# Patient Record
Sex: Male | Born: 1960
Health system: Southern US, Community
[De-identification: ages and names within clinical notes are randomized; demographics above are authoritative.]

## PROBLEM LIST (undated history)

## (undated) DIAGNOSIS — N400 Enlarged prostate without lower urinary tract symptoms: Secondary | ICD-10-CM

## (undated) DIAGNOSIS — I10 Essential (primary) hypertension: Secondary | ICD-10-CM

## (undated) DIAGNOSIS — E119 Type 2 diabetes mellitus without complications: Secondary | ICD-10-CM

## (undated) DIAGNOSIS — E785 Hyperlipidemia, unspecified: Secondary | ICD-10-CM

## (undated) DIAGNOSIS — M109 Gout, unspecified: Secondary | ICD-10-CM

## (undated) DIAGNOSIS — F32A Depression, unspecified: Secondary | ICD-10-CM

## (undated) DIAGNOSIS — G4700 Insomnia, unspecified: Secondary | ICD-10-CM

## (undated) DIAGNOSIS — R7989 Other specified abnormal findings of blood chemistry: Secondary | ICD-10-CM

## (undated) DIAGNOSIS — F329 Major depressive disorder, single episode, unspecified: Secondary | ICD-10-CM

## (undated) HISTORY — DX: Other specified abnormal findings of blood chemistry: R79.89

## (undated) HISTORY — DX: Benign prostatic hyperplasia without lower urinary tract symptoms: N40.0

## (undated) HISTORY — DX: Hyperlipidemia, unspecified: E78.5

## (undated) HISTORY — PX: HERNIA REPAIR: SHX51

## (undated) HISTORY — DX: Gout, unspecified: M10.9

## (undated) HISTORY — DX: Depression, unspecified: F32.A

## (undated) HISTORY — DX: Major depressive disorder, single episode, unspecified: F32.9

## (undated) HISTORY — DX: Type 2 diabetes mellitus without complications: E11.9

## (undated) HISTORY — DX: Essential (primary) hypertension: I10

## (undated) HISTORY — DX: Insomnia, unspecified: G47.00

---

## 2002-09-04 ENCOUNTER — Encounter: Admission: RE | Admit: 2002-09-04 | Discharge: 2002-09-04 | Payer: Self-pay | Admitting: Specialist

## 2002-09-04 ENCOUNTER — Encounter: Payer: Self-pay | Admitting: Specialist

## 2011-09-29 ENCOUNTER — Ambulatory Visit: Payer: Self-pay | Admitting: Unknown Physician Specialty

## 2011-09-29 DIAGNOSIS — I10 Essential (primary) hypertension: Secondary | ICD-10-CM

## 2011-09-29 LAB — BASIC METABOLIC PANEL
Anion Gap: 10 (ref 7–16)
BUN: 17 mg/dL (ref 7–18)
Calcium, Total: 9.1 mg/dL (ref 8.5–10.1)
Chloride: 106 mmol/L (ref 98–107)
Co2: 29 mmol/L (ref 21–32)
Creatinine: 0.84 mg/dL (ref 0.60–1.30)
EGFR (African American): >60
EGFR (Non-African Amer.): >60
Glucose: 142 mg/dL — ABNORMAL HIGH (ref 65–99)
Osmolality: 293 (ref 275–301)
Potassium: 3.5 mmol/L (ref 3.5–5.1)
Sodium: 145 mmol/L (ref 136–145)

## 2011-10-03 ENCOUNTER — Ambulatory Visit: Payer: Self-pay | Admitting: General Surgery

## 2013-06-06 ENCOUNTER — Encounter: Payer: 59 | Attending: Family Medicine | Admitting: Dietician

## 2013-06-06 ENCOUNTER — Encounter: Payer: Self-pay | Admitting: Dietician

## 2013-06-06 VITALS — Ht 72.0 in | Wt 294.9 lb

## 2013-06-06 DIAGNOSIS — Z713 Dietary counseling and surveillance: Secondary | ICD-10-CM | POA: Insufficient documentation

## 2013-06-06 DIAGNOSIS — E119 Type 2 diabetes mellitus without complications: Secondary | ICD-10-CM | POA: Insufficient documentation

## 2013-06-06 NOTE — Patient Instructions (Signed)
Goals:  Follow Diabetes Meal Plan as instructed  Eat 3 meals and 2 snacks, every 3-5 hrs  Limit carbohydrate intake to 45-60 grams carbohydrate/meal  Limit carbohydrate intake to 15-30 grams carbohydrate/snack  Add lean protein foods to meals/snacks  Monitor glucose levels as instructed by your doctor  Aim for 30 mins of physical activity daily  Bring food record and glucose log to your next nutrition visit 

## 2013-06-06 NOTE — Progress Notes (Signed)
Patient was seen on 06/06/13 for the first of a series of three diabetes self-management courses at the [facility].   Current HbA1c: 11.0 late July  The following learning objectives were met by the patient during this course:   Defines the role of glucose and insulin  Identifies type of diabetes and pathophysiology  Defines the diagnostic criteria for diabetes and prediabetes  States the risk factors for Type 2 Diabetes  States the symptoms of Type 2 Diabetes  Defines Type 2 Diabetes treatment goals  Defines Type 2 Diabetes treatment options  States the rationale for glucose monitoring  Identifies A1C, glucose targets, and testing times  Identifies proper sharps disposal  Defines the purpose of a diabetes food plan  Identifies carbohydrate food groups  Defines effects of carbohydrate foods on glucose levels  Identifies carbohydrate choices/grams/food labels  States benefits of physical activity and effect on glucose  Review of suggested activity guidelines  Handouts given during class include:  Type 2 Diabetes: Basics Book  My Food Plan Book  Food and Activity Log  Your patient has identified their diabetes self-care support plan as:  [facility] support group  Wife  Follow-Up Plan: Attend core 2 and core 3

## 2013-07-11 ENCOUNTER — Encounter: Payer: 59 | Attending: Family Medicine

## 2013-07-11 DIAGNOSIS — Z713 Dietary counseling and surveillance: Secondary | ICD-10-CM | POA: Insufficient documentation

## 2013-07-11 DIAGNOSIS — E119 Type 2 diabetes mellitus without complications: Secondary | ICD-10-CM | POA: Insufficient documentation

## 2013-07-19 NOTE — Progress Notes (Signed)
  Patient was seen on 07/11/13 for the third of a series of three diabetes self-management courses at the [facility]. The following learning objectives were met by the patient during this course:    Describe how diabetes changes over time   Identify diabetes complications and ways to prevent them   Describe strategies that can promote heart health including lowering blood pressure and cholesterol   Describe strategies to lower dietary fat and sodium in the diet   Identify physical activities that benefit cardiovascular health   Evaluate success in meeting personal goal   Describe the belief that they can live successfully with diabetes day to day   Establish 2-3 goals that they will plan to diligently work on until they return for the 4 month follow-up visit  The following handouts were given in class:  4 Month Follow Up Visit handout  Goal setting handout  Class evaluation form  Your patient has established the following 4 month goals for diabetes self-care:  Walk and start swimming again  Increase activity to 45 minutes 3Xwk  Plotting in [employer]  Follow-Up Plan: Patient will attend a 67month follow-up visit for diabetes self-management education.

## 2015-01-18 NOTE — Op Note (Signed)
PATIENT NAME:  Derek Duarte, Derek Duarte MR#:  [id number] DATE OF BIRTH:  1961-09-20  DATE OF PROCEDURE:  10/03/2011  PREOPERATIVE DIAGNOSIS: Umbilical hernia with incarcerated fat.   POSTOPERATIVE DIAGNOSIS: Umbilical hernia with incarcerated fat.  OPERATIVE PROCEDURE: Umbilical hernia primary repair.   OPERATING SURGEON: Robert Bellow, MD   ANESTHESIA: General by LMA under Dr. [place] Service, Marcaine 0.5% 30 mL plain, Toradol 30 mg.   ESTIMATED BLOOD LOSS: Minimal.   CLINICAL NOTE: This 54 year old male has developed a symptomatic umbilical hernia and was admitted for elective repair. He received Kefzol prior to the procedure.   OPERATIVE NOTE: With the patient under adequate general anesthesia, the abdomen was prepped with ChloraPrep and draped. The patient had shaved the abdomen the morning of surgery at home (he had not been instructed to do so). No gross evidence of skin trauma was evident. An infraumbilical incision was made after instillation of local anesthesia. Skin was incised sharply and hemostasis achieved by electrocautery. The umbilical skin was elevated and separated from the sac which was excised at the fascial level and discarded. There was a 4 cm mass of omentum through a 1.2 cm fascial defect. This was excised and discarded with hemostasis achieved by 2-0 Vicryl ties. The fascial defect was felt small enough for primary repair. Interrupted 0 Surgilon sutures were placed under direct vision. They were brought together without tension. They were placed about 4 mm apart and carefully tied. The area was infiltrated with Marcaine and Toradol instilled into the tissues for postoperative analgesia. The umbilical skin was tacked to the fascia with 2-0 Vicryl. The adipose layer was closed with a running 2-0 Vicryl. The skin was closed with a running 4-0 Vicryl subcuticular suture. Benzoin, Steri-Strips, Telfa, and Tegaderm dressings were applied.      The patient tolerated the procedure  well and was taken to the recovery room in stable condition.   ____________________________ Robert Bellow, MD jwb:drc D: 10/03/2011 17:08:41 ET T: 10/04/2011 09:22:15 ET JOB#: [id number]  cc: Robert Bellow, MD, <Dictator> Dr. Melinda Crutch, [place], [facility]  JEFFREY Amedeo Kinsman MD ELECTRONICALLY SIGNED 10/10/2011 22:05

## 2016-04-29 ENCOUNTER — Emergency Department (HOSPITAL_COMMUNITY)
Admission: EM | Admit: 2016-04-29 | Discharge: 2016-04-29 | Disposition: A | Discharge status: Home / Self Care | Payer: Managed Care, Other (non HMO) | Attending: Emergency Medicine | Admitting: Emergency Medicine

## 2016-04-29 ENCOUNTER — Encounter (HOSPITAL_COMMUNITY): Payer: Self-pay

## 2016-04-29 DIAGNOSIS — E119 Type 2 diabetes mellitus without complications: Secondary | ICD-10-CM | POA: Insufficient documentation

## 2016-04-29 DIAGNOSIS — Z7984 Long term (current) use of oral hypoglycemic drugs: Secondary | ICD-10-CM | POA: Insufficient documentation

## 2016-04-29 DIAGNOSIS — T782XXA Anaphylactic shock, unspecified, initial encounter: Secondary | ICD-10-CM | POA: Insufficient documentation

## 2016-04-29 DIAGNOSIS — Z79899 Other long term (current) drug therapy: Secondary | ICD-10-CM | POA: Insufficient documentation

## 2016-04-29 DIAGNOSIS — I10 Essential (primary) hypertension: Secondary | ICD-10-CM | POA: Insufficient documentation

## 2016-04-29 MED ORDER — PREDNISONE 20 MG PO TABS: 60.0000 mg | ORAL_TABLET | Freq: Every day | ORAL | 0 refills | Status: DC

## 2016-04-29 MED ORDER — FAMOTIDINE 20 MG PO TABS: 20.0000 mg | ORAL_TABLET | Freq: Two times a day (BID) | ORAL | 0 refills | Status: DC

## 2016-04-29 MED ORDER — EPINEPHRINE 0.3 MG/0.3ML IJ SOAJ: 0.3000 mg | Freq: Once | INTRAMUSCULAR | 1 refills | Status: AC

## 2016-04-29 MED ORDER — METHYLPREDNISOLONE SODIUM SUCC 125 MG IJ SOLR
125.0000 mg | Freq: Once | INTRAMUSCULAR | Status: AC
  Administered 2016-04-29: 125 mg via INTRAVENOUS
  Filled 2016-04-29: qty 2

## 2016-04-29 MED ORDER — DIPHENHYDRAMINE HCL 25 MG PO CAPS: 25.0000 mg | ORAL_CAPSULE | Freq: Four times a day (QID) | ORAL | 0 refills | Status: DC | PRN

## 2016-04-29 MED ORDER — FAMOTIDINE IN NACL 20-0.9 MG/50ML-% IV SOLN
20.0000 mg | Freq: Once | INTRAVENOUS | Status: AC
  Administered 2016-04-29: 20 mg via INTRAVENOUS
  Filled 2016-04-29: qty 50

## 2016-04-29 NOTE — ED Provider Notes (Signed)
[facility] DEPT Provider Note   CSN: [id number] Arrival date & time: 04/29/16  1514  First Provider Contact:  First MD Initiated Contact with Patient 04/29/16 1525        History   Chief Complaint Chief Complaint  Patient presents with  . Allergic Reaction    HPI Derek Duarte is a 55 y.o. male.  Patient is a Neurosurgeon. He does not have a history of allergic reactions to bee stings but because of his frequent bee stings he has an EpiPen that he carries with him. Patient was stung by yellow jackets today and not diabetes. He had approximately 20 stings. He started developing itching and hives. He went to his doctor's office and was given an epinephrine injection with subsequent improvement of his symptoms. He was also given Benadryl. Patient was sent to the emergency room to be monitored. He is feeling much better now. He denies any shortness of breath. No difficulty swallowing.   The history is provided by the patient.  Allergic Reaction  Presenting symptoms: itching and rash   Presenting symptoms: no difficulty breathing, no difficulty swallowing, no swelling and no wheezing   Severity:  Moderate Duration: Symptoms started today after being stung by multiple yellow jackets. Prior allergic episodes:  No prior episodes Context: insect bite/sting   Relieved by:  Antihistamines and epinephrine Worsened by:  Nothing   Past Medical History:  Diagnosis Date  . Diabetes mellitus without complication ([place])   . Hypertension     There are no active problems to display for this patient.   Past Surgical History:  Procedure Laterality Date  . HERNIA REPAIR         Home Medications    Prior to Admission medications   Medication Sig Start Date End Date Taking? Authorizing Provider  allopurinol (ZYLOPRIM) 300 MG tablet Take 300 mg by mouth daily.   Yes Historical Provider, MD  amLODipine (NORVASC) 10 MG tablet Take 10 mg by mouth daily.   Yes Historical Provider, MD    escitalopram (LEXAPRO) 10 MG tablet Take 10 mg by mouth daily. 04/15/16  Yes Historical Provider, MD  glimepiride (AMARYL) 4 MG tablet Take 4 mg by mouth 2 (two) times daily. 02/07/16  Yes Historical Provider, MD  lisinopril-hydrochlorothiazide (PRINZIDE,ZESTORETIC) 10-12.5 MG per tablet Take 1 tablet by mouth daily.   Yes Historical Provider, MD  metFORMIN (GLUCOPHAGE) 500 MG tablet Take 500 mg by mouth 2 (two) times daily with a meal.   Yes Historical Provider, MD  traZODone (DESYREL) 150 MG tablet Take 150 mg by mouth at bedtime. 03/15/16  Yes Historical Provider, MD  diphenhydrAMINE (BENADRYL) 25 mg capsule Take 1 capsule (25 mg total) by mouth every 6 (six) hours as needed. 04/29/16   Dorie Rank, MD  EPINEPHrine 0.3 mg/0.3 mL IJ SOAJ injection Inject 0.3 mLs (0.3 mg total) into the muscle once. 04/29/16 04/29/16  Dorie Rank, MD  famotidine (PEPCID) 20 MG tablet Take 1 tablet (20 mg total) by mouth 2 (two) times daily. 04/29/16   Dorie Rank, MD  predniSONE (DELTASONE) 20 MG tablet Take 3 tablets (60 mg total) by mouth daily. 04/29/16   Dorie Rank, MD    Family History Family History  Problem Relation Age of Onset  . Hypertension Other     Social History Social History  Substance Use Topics  . Smoking status: Never Smoker  . Smokeless tobacco: Never Used  . Alcohol use 1.2 oz/week    2 Cans of beer per week  Allergies   Review of patient's allergies indicates no known allergies.   Review of Systems Review of Systems  HENT: Negative for trouble swallowing.   Respiratory: Negative for wheezing.   Skin: Positive for itching and rash.  All other systems reviewed and are negative.    Physical Exam Updated Vital Signs There were no vitals taken for this visit.  Physical Exam  Constitutional: He appears well-developed and well-nourished. No distress.  HENT:  Head: Normocephalic and atraumatic.  Right Ear: External ear normal.  Left Ear: External ear normal.  No pharyngeal edema, no  uvular edema  Eyes: Conjunctivae are normal. Right eye exhibits no discharge. Left eye exhibits no discharge. No scleral icterus.  Neck: Neck supple. No tracheal deviation present.  Cardiovascular: Normal rate, regular rhythm and intact distal pulses.   Pulmonary/Chest: Effort normal and breath sounds normal. No stridor. No respiratory distress. He has no wheezes. He has no rales.  Abdominal: Soft. Bowel sounds are normal. He exhibits no distension. There is no tenderness. There is no rebound and no guarding.  Musculoskeletal: He exhibits no edema or tenderness.  Neurological: He is alert. He has normal strength. No cranial nerve deficit (no facial droop, extraocular movements intact, no slurred speech) or sensory deficit. He exhibits normal muscle tone. He displays no seizure activity. Coordination normal.  Skin: Skin is warm and dry. No rash noted.  No urticaria  Psychiatric: He has a normal mood and affect.  Nursing note and vitals reviewed.    ED Treatments / Results    Procedures Procedures (including critical care time)  Medications Ordered in ED Medications  methylPREDNISolone sodium succinate (SOLU-MEDROL) 125 mg/2 mL injection 125 mg (125 mg Intravenous Given 04/29/16 1639)  famotidine (PEPCID) IVPB 20 mg premix (0 mg Intravenous Stopped 04/29/16 1737)     Initial Impression / Assessment and Plan / ED Course  I have reviewed the triage vital signs and the nursing notes.  Pertinent labs & imaging results that were available during my care of the patient were reviewed by me and considered in my medical decision making (see chart for details).  Clinical Course  Comment By Time  Patient continues to feel fine without any recurrent symptoms Dorie Rank, MD 08/04 1724    Patient presented to the emergency room after receiving epinephrine for an anaphylactic reaction associated with yellow jacket stings. The patient has been monitored in the emergency room for a couple of hours. He  has remained symptom free. Plan on discharge home with antihistamines, steroids and an EpiPen.   Final Clinical Impressions(s) / ED Diagnoses   Final diagnoses:  Anaphylaxis, initial encounter    New Prescriptions New Prescriptions   DIPHENHYDRAMINE (BENADRYL) 25 MG CAPSULE    Take 1 capsule (25 mg total) by mouth every 6 (six) hours as needed.   EPINEPHRINE 0.3 MG/0.3 ML IJ SOAJ INJECTION    Inject 0.3 mLs (0.3 mg total) into the muscle once.   FAMOTIDINE (PEPCID) 20 MG TABLET    Take 1 tablet (20 mg total) by mouth 2 (two) times daily.   PREDNISONE (DELTASONE) 20 MG TABLET    Take 3 tablets (60 mg total) by mouth daily.     Dorie Rank, MD 04/29/16 1739

## 2016-04-29 NOTE — ED Triage Notes (Addendum)
Per [facility] pt. Has hives redness and swelling to face. The hives have decreased. Pt. Reports that 20 yellow jackets stung him while moving a log. He reports taking benadryl - 50 Mg. Had a friend drive him to [employer]. Ems reports that at [facility] they gave 3 mg in deltoid of Epi. Then called EMS to transport him to hospital. No medications given in route. Did a 12 lead because HR was 104 and had some HTN when EMS picked him up. Pt. Reports no difficulty breathing but some numbness around mouth and lips and voice change. EMS reports clear lung sounds.  HR 104, BP 178/100 on scene, and CBG 252  Recent BP 146/92 HR 103 o2 93-94 and 96 on O2. On 2L nasal canula

## 2016-04-29 NOTE — ED Notes (Signed)
Bed: WA01 Expected date:  Expected time:  Means of arrival:  Comments: Allergic reaction

## 2016-04-29 NOTE — ED Notes (Signed)
Vitals signs were on done but never put into system by Roselyn Reef, RN but as I recall they were within  normal limits.

## 2016-04-29 NOTE — ED Notes (Signed)
MD at bedside. 

## 2016-04-29 NOTE — ED Notes (Signed)
Pt was cycling on monitor for vital signs. Was not recorded or approved in system to show up on chart.

## 2016-09-30 DIAGNOSIS — S91209A Unspecified open wound of unspecified toe(s) with damage to nail, initial encounter: Secondary | ICD-10-CM | POA: Diagnosis not present

## 2016-11-28 DIAGNOSIS — E291 Testicular hypofunction: Secondary | ICD-10-CM | POA: Diagnosis not present

## 2016-11-28 DIAGNOSIS — E1165 Type 2 diabetes mellitus with hyperglycemia: Secondary | ICD-10-CM | POA: Diagnosis not present

## 2017-03-08 DIAGNOSIS — E1165 Type 2 diabetes mellitus with hyperglycemia: Secondary | ICD-10-CM | POA: Diagnosis not present

## 2017-03-08 DIAGNOSIS — D229 Melanocytic nevi, unspecified: Secondary | ICD-10-CM | POA: Diagnosis not present

## 2017-03-21 DIAGNOSIS — H10413 Chronic giant papillary conjunctivitis, bilateral: Secondary | ICD-10-CM | POA: Diagnosis not present

## 2017-03-21 DIAGNOSIS — E119 Type 2 diabetes mellitus without complications: Secondary | ICD-10-CM | POA: Diagnosis not present

## 2017-06-01 DIAGNOSIS — L821 Other seborrheic keratosis: Secondary | ICD-10-CM | POA: Diagnosis not present

## 2017-06-01 DIAGNOSIS — Z1283 Encounter for screening for malignant neoplasm of skin: Secondary | ICD-10-CM | POA: Diagnosis not present

## 2017-06-15 DIAGNOSIS — Z23 Encounter for immunization: Secondary | ICD-10-CM | POA: Diagnosis not present

## 2017-06-15 DIAGNOSIS — E291 Testicular hypofunction: Secondary | ICD-10-CM | POA: Diagnosis not present

## 2017-06-15 DIAGNOSIS — E1165 Type 2 diabetes mellitus with hyperglycemia: Secondary | ICD-10-CM | POA: Diagnosis not present

## 2017-06-15 DIAGNOSIS — I10 Essential (primary) hypertension: Secondary | ICD-10-CM | POA: Diagnosis not present

## 2017-06-15 DIAGNOSIS — Z0000 Encounter for general adult medical examination without abnormal findings: Secondary | ICD-10-CM | POA: Diagnosis not present

## 2017-06-16 DIAGNOSIS — E1165 Type 2 diabetes mellitus with hyperglycemia: Secondary | ICD-10-CM | POA: Diagnosis not present

## 2017-06-16 DIAGNOSIS — Z0000 Encounter for general adult medical examination without abnormal findings: Secondary | ICD-10-CM | POA: Diagnosis not present

## 2017-06-16 DIAGNOSIS — E291 Testicular hypofunction: Secondary | ICD-10-CM | POA: Diagnosis not present

## 2017-06-23 DIAGNOSIS — D225 Melanocytic nevi of trunk: Secondary | ICD-10-CM | POA: Diagnosis not present

## 2017-06-23 DIAGNOSIS — C44519 Basal cell carcinoma of skin of other part of trunk: Secondary | ICD-10-CM | POA: Diagnosis not present

## 2017-06-23 DIAGNOSIS — D485 Neoplasm of uncertain behavior of skin: Secondary | ICD-10-CM | POA: Diagnosis not present

## 2017-06-23 DIAGNOSIS — L918 Other hypertrophic disorders of the skin: Secondary | ICD-10-CM | POA: Diagnosis not present

## 2017-10-06 DIAGNOSIS — E1140 Type 2 diabetes mellitus with diabetic neuropathy, unspecified: Secondary | ICD-10-CM | POA: Diagnosis not present

## 2017-10-06 DIAGNOSIS — R591 Generalized enlarged lymph nodes: Secondary | ICD-10-CM | POA: Diagnosis not present

## 2017-12-11 DIAGNOSIS — Z79899 Other long term (current) drug therapy: Secondary | ICD-10-CM | POA: Diagnosis not present

## 2017-12-11 DIAGNOSIS — E291 Testicular hypofunction: Secondary | ICD-10-CM | POA: Diagnosis not present

## 2017-12-11 DIAGNOSIS — E1165 Type 2 diabetes mellitus with hyperglycemia: Secondary | ICD-10-CM | POA: Diagnosis not present

## 2018-02-23 DIAGNOSIS — E291 Testicular hypofunction: Secondary | ICD-10-CM | POA: Diagnosis not present

## 2018-02-23 DIAGNOSIS — E1165 Type 2 diabetes mellitus with hyperglycemia: Secondary | ICD-10-CM | POA: Diagnosis not present

## 2018-03-22 DIAGNOSIS — E119 Type 2 diabetes mellitus without complications: Secondary | ICD-10-CM | POA: Diagnosis not present

## 2018-04-11 DIAGNOSIS — K210 Gastro-esophageal reflux disease with esophagitis: Secondary | ICD-10-CM | POA: Diagnosis not present

## 2018-05-31 DIAGNOSIS — E1165 Type 2 diabetes mellitus with hyperglycemia: Secondary | ICD-10-CM | POA: Diagnosis not present

## 2018-05-31 DIAGNOSIS — Z0000 Encounter for general adult medical examination without abnormal findings: Secondary | ICD-10-CM | POA: Diagnosis not present

## 2018-05-31 DIAGNOSIS — M109 Gout, unspecified: Secondary | ICD-10-CM | POA: Diagnosis not present

## 2018-05-31 DIAGNOSIS — Z13220 Encounter for screening for lipoid disorders: Secondary | ICD-10-CM | POA: Diagnosis not present

## 2018-05-31 DIAGNOSIS — E291 Testicular hypofunction: Secondary | ICD-10-CM | POA: Diagnosis not present

## 2018-06-04 DIAGNOSIS — Z23 Encounter for immunization: Secondary | ICD-10-CM | POA: Diagnosis not present

## 2018-06-04 DIAGNOSIS — E785 Hyperlipidemia, unspecified: Secondary | ICD-10-CM | POA: Diagnosis not present

## 2018-06-04 DIAGNOSIS — Z0000 Encounter for general adult medical examination without abnormal findings: Secondary | ICD-10-CM | POA: Diagnosis not present

## 2018-06-04 DIAGNOSIS — E1169 Type 2 diabetes mellitus with other specified complication: Secondary | ICD-10-CM | POA: Diagnosis not present

## 2018-06-04 DIAGNOSIS — I10 Essential (primary) hypertension: Secondary | ICD-10-CM | POA: Diagnosis not present

## 2018-07-26 DIAGNOSIS — H903 Sensorineural hearing loss, bilateral: Secondary | ICD-10-CM | POA: Diagnosis not present

## 2018-08-07 DIAGNOSIS — I639 Cerebral infarction, unspecified: Secondary | ICD-10-CM | POA: Diagnosis not present

## 2018-08-07 DIAGNOSIS — R42 Dizziness and giddiness: Secondary | ICD-10-CM | POA: Diagnosis not present

## 2018-08-07 DIAGNOSIS — R413 Other amnesia: Secondary | ICD-10-CM | POA: Diagnosis not present

## 2018-08-09 ENCOUNTER — Other Ambulatory Visit: Payer: Self-pay | Admitting: Family Medicine

## 2018-08-09 DIAGNOSIS — I639 Cerebral infarction, unspecified: Secondary | ICD-10-CM

## 2018-08-11 ENCOUNTER — Ambulatory Visit
Admission: RE | Admit: 2018-08-11 | Discharge: 2018-08-11 | Disposition: A | Discharge status: Home / Self Care | Payer: Managed Care, Other (non HMO) | Source: Ambulatory Visit | Attending: Family Medicine | Admitting: Family Medicine

## 2018-08-11 DIAGNOSIS — I639 Cerebral infarction, unspecified: Secondary | ICD-10-CM

## 2018-08-11 DIAGNOSIS — R42 Dizziness and giddiness: Secondary | ICD-10-CM | POA: Diagnosis not present

## 2018-08-13 ENCOUNTER — Ambulatory Visit
Admission: RE | Admit: 2018-08-13 | Discharge: 2018-08-13 | Disposition: A | Discharge status: Home / Self Care | Payer: Managed Care, Other (non HMO) | Source: Ambulatory Visit | Attending: Family Medicine | Admitting: Family Medicine

## 2018-08-13 DIAGNOSIS — G459 Transient cerebral ischemic attack, unspecified: Secondary | ICD-10-CM | POA: Diagnosis not present

## 2018-08-13 DIAGNOSIS — I639 Cerebral infarction, unspecified: Secondary | ICD-10-CM

## 2018-08-28 ENCOUNTER — Encounter: Payer: Self-pay | Admitting: Neurology

## 2018-08-28 ENCOUNTER — Encounter

## 2018-08-28 ENCOUNTER — Ambulatory Visit (INDEPENDENT_AMBULATORY_CARE_PROVIDER_SITE_OTHER): Payer: 59 | Admitting: Neurology

## 2018-08-28 VITALS — BP 161/88 | Ht 72.0 in | Wt 304.6 lb

## 2018-08-28 DIAGNOSIS — R42 Dizziness and giddiness: Secondary | ICD-10-CM | POA: Diagnosis not present

## 2018-08-28 DIAGNOSIS — G459 Transient cerebral ischemic attack, unspecified: Secondary | ICD-10-CM | POA: Diagnosis not present

## 2018-08-28 NOTE — Progress Notes (Signed)
[facility] [address] [place]. [place] [address] [phone]       OFFICE CONSULT NOTE  [name] Date of Birth:  12/29/1960 Medical Record Number:  [id number]   Referring MD: Melinda Derek Duarte  Reason for Referral: TIA HPI: [name] is a 57 year old Caucasian male seen today for initial office consultation visit for TIA.  History is obtained from the patient and review of referral notes as well as electronic medical records.  I personally reviewed imaging films in PACS.  He developed sudden onset of dizziness on 08/03/2018 when he bent down he had trouble getting up because he lost his balance.  He was able to sit up but felt off balance and dizzy.  He also noticed his left hand was weak and clumsy.  He has diminished fine motor skills while using his hand.  The symptoms started improving in an hour or so but he states his dizziness is still not completely recovered back to normal.  He saw his primary physician the next day who ordered an MRI scan of the brain which was done on 08/11/2018 which I personally reviewed and was normal.  Carotid ultrasound done on 08/13/2018 shows no significant extracranial stenosis.  He states he had lab work done in September this year and hemoglobin A1c was 7.2 and lipid profile was satisfactory except elevated triglycerides.  He was started on Crestor 5 mg but he takes it only 3 days a week.  He states his blood pressure is also not well controlled and today it is 161/88.  He has been started on aspirin which is tolerating well without bruising or bleeding.  He has only minor paresthesias at night occasionally from his diabetic neuropathy.  He states overall his gait and balance are fine.  He denies true vertigo, diplopia, blurred vision focal extremity numbness during this episode. ROS:   14 system review of systems is positive for hearing loss, ringing in the ears, importance, urination problems, headache, weakness, dizziness, tremor,  depression, insomnia and all other symptoms systems negative  PMH:  Past Medical History:  Diagnosis Date  . BPH (benign prostatic hyperplasia)   . Depression   . Diabetes mellitus without complication ([place])   . Gout   . Hyperlipidemia   . Hypertension   . Insomnia   . Low testosterone     Social History:  Social History   Socioeconomic History  . Marital status: Married    Spouse name: Not on file  . Number of children: Not on file  . Years of education: Not on file  . Highest education level: Not on file  Occupational History  . Not on file  Social Needs  . Financial resource strain: Not on file  . Food insecurity:    Worry: Not on file    Inability: Not on file  . Transportation needs:    Medical: Not on file    Non-medical: Not on file  Tobacco Use  . Smoking status: Never Smoker  . Smokeless tobacco: Never Used  Substance and Sexual Activity  . Alcohol use: Yes    Alcohol/week: 2.0 standard drinks    Types: 2 Cans of beer per week  . Drug use: Not Currently  . Sexual activity: Not on file  Lifestyle  . Physical activity:    Days per week: Not on file    Minutes per session: Not on file  . Stress: Not on file  Relationships  . Social connections:    Talks  on phone: Not on file    Gets together: Not on file    Attends religious service: Not on file    Active member of club or organization: Not on file    Attends meetings of clubs or organizations: Not on file    Relationship status: Not on file  . Intimate partner violence:    Fear of current or ex partner: Not on file    Emotionally abused: Not on file    Physically abused: Not on file    Forced sexual activity: Not on file  Other Topics Concern  . Not on file  Social History Narrative  . Not on file    Medications:   Current Outpatient Medications on File Prior to Visit  Medication Sig Dispense Refill  . allopurinol (ZYLOPRIM) 300 MG tablet Take 300 mg by mouth daily.    Marland Kitchen amLODipine  (NORVASC) 10 MG tablet Take 10 mg by mouth daily.    Marland Kitchen aspirin 325 MG tablet Take 325 mg by mouth daily.    Marland Kitchen buPROPion HCl (WELLBUTRIN XL PO) Take 450 mg by mouth.    . escitalopram (LEXAPRO) 10 MG tablet Take 10 mg by mouth daily.    Marland Kitchen glimepiride (AMARYL) 4 MG tablet Take 4 mg by mouth 2 (two) times daily.    Marland Kitchen lisinopril-hydrochlorothiazide (PRINZIDE,ZESTORETIC) 10-12.5 MG per tablet Take 1 tablet by mouth daily.    . metFORMIN (GLUCOPHAGE) 500 MG tablet Take 500 mg by mouth 2 (two) times daily with a meal.    . rosuvastatin (CRESTOR) 5 MG tablet Take 5 mg by mouth daily.    . traZODone (DESYREL) 150 MG tablet Take 150 mg by mouth at bedtime.     No current facility-administered medications on file prior to visit.     Allergies:  No Known Allergies  Physical Exam General: obese middle aged Caucasian male seated, in no evident distress Head: head normocephalic and atraumatic.   Neck: supple with no carotid or supraclavicular bruits Cardiovascular: regular rate and rhythm, no murmurs Musculoskeletal: no deformity Skin:  no rash/petichiae Vascular:  Normal pulses all extremities  Neurologic Exam Mental Status: Awake and fully alert. Oriented to place and time. Recent and remote memory intact. Attention span, concentration and fund of knowledge appropriate. Mood and affect appropriate.  Cranial Nerves: Fundoscopic exam reveals sharp disc margins. Pupils equal, briskly reactive to light. Extraocular movements full without nystagmus. Visual fields full to confrontation. Hearing intact. Facial sensation intact. Face, tongue, palate moves normally and symmetrically.  Motor: Normal bulk and tone. Normal strength in all tested extremity muscles. Sensory.: intact to touch , pinprick , position and vibratory sensation.  Coordination: Rapid alternating movements normal in all extremities. Finger-to-nose and heel-to-shin performed accurately bilaterally. Gait and Station: Arises from chair  without difficulty. Stance is normal. Gait demonstrates normal stride length and balance . Able to heel, toe and tandem walk with moderate difficulty.  Reflexes: 1+ and symmetric. Toes downgoing.   NIHSS  0 Modified Rankin  0   ASSESSMENT: 57 year old Caucasian male with transient episode of dizziness and left upper extremity incoordination and weakness likely posterior circulation TIA versus small brainstem infarct not visualized on MRI in November 2019.  Vascular risk factors of diabetes, hypertension, hyperlipidemia and obesity.     PLAN: I had a long d/w patient about his recent stroke, risk for recurrent stroke/TIAs, personally independently reviewed imaging studies and stroke evaluation results and answered questions.Recommend check MRA neck and brain, echo cardiogram and polysomnogram sleep study..Continue  aspirin 325 mg daily  for secondary stroke prevention and maintain strict control of hypertension with blood pressure goal below 130/90, diabetes with hemoglobin A1c goal below 6.5% and lipids with LDL cholesterol goal below 70 mg/dL. I also advised the patient to eat a healthy diet with plenty of whole grains, cereals, fruits and vegetables, exercise regularly and maintain ideal body weight .greater than 50% time during this 45-minute visit was spent on counseling and coordination of care about his TIA and discussion about stroke prevention and treatment and answering questions.  Followup in the future with my nurse practitioner Janett Billow in  3 months or call earlier if necessary Antony Contras, MD  [facility] [address] [place], [place] [address]  Phone [phone] Fax [phone] Note: This document was prepared with digital dictation and possible smart phrase technology. Any transcriptional errors that result from this process are unintentional.

## 2018-08-28 NOTE — Patient Instructions (Signed)
I had a long d/w patient about his recent stroke, risk for recurrent stroke/TIAs, personally independently reviewed imaging studies and stroke evaluation results and answered questions.Recommend check MRA neck and brain, echo cardiogram and polysomnogram sleep study..Continue aspirin 325 mg daily  for secondary stroke prevention and maintain strict control of hypertension with blood pressure goal below 130/90, diabetes with hemoglobin A1c goal below 6.5% and lipids with LDL cholesterol goal below 70 mg/dL. I also advised the patient to eat a healthy diet with plenty of whole grains, cereals, fruits and vegetables, exercise regularly and maintain ideal body weight Followup in the future with my nurse practitioner Janett Billow in  3 months or call earlier if necessary  Stroke Prevention Some medical conditions and behaviors are associated with a higher chance of having a stroke. You can help prevent a stroke by making nutrition, lifestyle, and other changes, including managing any medical conditions you may have. What nutrition changes can be made?  Eat healthy foods. You can do this by: ? Choosing foods high in fiber, such as fresh fruits and vegetables and whole grains. ? Eating at least 5 or more servings of fruits and vegetables a day. Try to fill half of your plate at each meal with fruits and vegetables. ? Choosing lean protein foods, such as lean cuts of meat, poultry without skin, fish, tofu, beans, and nuts. ? Eating low-fat dairy products. ? Avoiding foods that are high in salt (sodium). This can help lower blood pressure. ? Avoiding foods that have saturated fat, trans fat, and cholesterol. This can help prevent high cholesterol. ? Avoiding processed and premade foods.  Follow your health care provider's specific guidelines for losing weight, controlling high blood pressure (hypertension), lowering high cholesterol, and managing diabetes. These may include: ? Reducing your daily calorie  intake. ? Limiting your daily sodium intake to 1,500 milligrams (mg). ? Using only healthy fats for cooking, such as olive oil, canola oil, or sunflower oil. ? Counting your daily carbohydrate intake. What lifestyle changes can be made?  Maintain a healthy weight. Talk to your health care provider about your ideal weight.  Get at least 30 minutes of moderate physical activity at least 5 days a week. Moderate activity includes brisk walking, biking, and swimming.  Do not use any products that contain nicotine or tobacco, such as cigarettes and e-cigarettes. If you need help quitting, ask your health care provider. It may also be helpful to avoid exposure to secondhand smoke.  Limit alcohol intake to no more than 1 drink a day for nonpregnant women and 2 drinks a day for men. One drink equals 12 oz of beer, 5 oz of wine, or 1 oz of hard liquor.  Stop any illegal drug use.  Avoid taking birth control pills. Talk to your health care provider about the risks of taking birth control pills if: ? You are over 92 years old. ? You smoke. ? You get migraines. ? You have ever had a blood clot. What other changes can be made?  Manage your cholesterol levels. ? Eating a healthy diet is important for preventing high cholesterol. If cholesterol cannot be managed through diet alone, you may also need to take medicines. ? Take any prescribed medicines to control your cholesterol as told by your health care provider.  Manage your diabetes. ? Eating a healthy diet and exercising regularly are important parts of managing your blood sugar. If your blood sugar cannot be managed through diet and exercise, you may need to take  medicines. ? Take any prescribed medicines to control your diabetes as told by your health care provider.  Control your hypertension. ? To reduce your risk of stroke, try to keep your blood pressure below 130/80. ? Eating a healthy diet and exercising regularly are an important part  of controlling your blood pressure. If your blood pressure cannot be managed through diet and exercise, you may need to take medicines. ? Take any prescribed medicines to control hypertension as told by your health care provider. ? Ask your health care provider if you should monitor your blood pressure at home. ? Have your blood pressure checked every year, even if your blood pressure is normal. Blood pressure increases with age and some medical conditions.  Get evaluated for sleep disorders (sleep apnea). Talk to your health care provider about getting a sleep evaluation if you snore a lot or have excessive sleepiness.  Take over-the-counter and prescription medicines only as told by your health care provider. Aspirin or blood thinners (antiplatelets or anticoagulants) may be recommended to reduce your risk of forming blood clots that can lead to stroke.  Make sure that any other medical conditions you have, such as atrial fibrillation or atherosclerosis, are managed. What are the warning signs of a stroke? The warning signs of a stroke can be easily remembered as BEFAST.  B is for balance. Signs include: ? Dizziness. ? Loss of balance or coordination. ? Sudden trouble walking.  E is for eyes. Signs include: ? A sudden change in vision. ? Trouble seeing.  F is for face. Signs include: ? Sudden weakness or numbness of the face. ? The face or eyelid drooping to one side.  A is for arms. Signs include: ? Sudden weakness or numbness of the arm, usually on one side of the body.  S is for speech. Signs include: ? Trouble speaking (aphasia). ? Trouble understanding.  T is for time. ? These symptoms may represent a serious problem that is an emergency. Do not wait to see if the symptoms will go away. Get medical help right away. Call your local emergency services (911 in the [place] Do not drive yourself to the hospital.  Other signs of stroke may include: ? A sudden, severe headache  with no known cause. ? Nausea or vomiting. ? Seizure.  Where to find more information: For more information, visit:  American Stroke Association: www.strokeassociation.org  National Stroke Association: www.stroke.org  Summary  You can prevent a stroke by eating healthy, exercising, not smoking, limiting alcohol intake, and managing any medical conditions you may have.  Do not use any products that contain nicotine or tobacco, such as cigarettes and e-cigarettes. If you need help quitting, ask your health care provider. It may also be helpful to avoid exposure to secondhand smoke.  Remember BEFAST for warning signs of stroke. Get help right away if you or a loved one has any of these signs. This information is not intended to replace advice given to you by your health care provider. Make sure you discuss any questions you have with your health care provider. Document Released: 10/20/2004 Document Revised: 10/18/2016 Document Reviewed: 10/18/2016 Elsevier Interactive Patient Education  [employer].

## 2018-08-29 ENCOUNTER — Telehealth: Payer: Self-pay | Admitting: Neurology

## 2018-08-29 NOTE — Telephone Encounter (Signed)
[employer] approved the MRA Neck: [id number] (exp. 08/29/18 to 10/13/18)  The MRA head is pending since he recently had a MRI brain. I faxed clinical notes.

## 2018-08-30 NOTE — Telephone Encounter (Signed)
Derek Duarte called to check the status for the MRA Head and it still pending.

## 2018-08-31 ENCOUNTER — Encounter: Payer: Self-pay | Admitting: Neurology

## 2018-08-31 NOTE — Telephone Encounter (Signed)
[employer] MRA Head auth: [id number] (Exp. 08/31/18 to 10/15/18) order sent to GI. They will reach out to the pt to schedule.

## 2018-09-07 ENCOUNTER — Other Ambulatory Visit: Payer: Self-pay

## 2018-09-07 ENCOUNTER — Ambulatory Visit (HOSPITAL_COMMUNITY): Payer: 59 | Attending: Cardiology

## 2018-09-07 DIAGNOSIS — G459 Transient cerebral ischemic attack, unspecified: Secondary | ICD-10-CM | POA: Diagnosis present

## 2018-09-07 MED ORDER — PERFLUTREN LIPID MICROSPHERE
1.0000 mL | INTRAVENOUS | Status: AC | PRN
  Administered 2018-09-07: 3 mL via INTRAVENOUS

## 2018-09-20 DIAGNOSIS — E785 Hyperlipidemia, unspecified: Secondary | ICD-10-CM | POA: Diagnosis not present

## 2018-09-20 DIAGNOSIS — G459 Transient cerebral ischemic attack, unspecified: Secondary | ICD-10-CM | POA: Diagnosis not present

## 2018-09-20 DIAGNOSIS — I119 Hypertensive heart disease without heart failure: Secondary | ICD-10-CM | POA: Diagnosis not present

## 2018-09-21 ENCOUNTER — Other Ambulatory Visit: Payer: Managed Care, Other (non HMO)

## 2018-09-21 ENCOUNTER — Ambulatory Visit
Admission: RE | Admit: 2018-09-21 | Discharge: 2018-09-21 | Disposition: A | Discharge status: Home / Self Care | Payer: 59 | Source: Ambulatory Visit | Attending: Neurology | Admitting: Neurology

## 2018-09-21 DIAGNOSIS — R42 Dizziness and giddiness: Secondary | ICD-10-CM

## 2018-09-21 DIAGNOSIS — G459 Transient cerebral ischemic attack, unspecified: Secondary | ICD-10-CM | POA: Diagnosis not present

## 2018-09-21 MED ORDER — GADOBENATE DIMEGLUMINE 529 MG/ML IV SOLN
20.0000 mL | Freq: Once | INTRAVENOUS | Status: AC | PRN
  Administered 2018-09-21: 20 mL via INTRAVENOUS

## 2018-09-22 ENCOUNTER — Other Ambulatory Visit: Payer: Self-pay

## 2018-09-27 DIAGNOSIS — I493 Ventricular premature depolarization: Secondary | ICD-10-CM | POA: Diagnosis not present

## 2018-09-27 DIAGNOSIS — G459 Transient cerebral ischemic attack, unspecified: Secondary | ICD-10-CM | POA: Diagnosis not present

## 2018-09-28 ENCOUNTER — Telehealth: Payer: Self-pay

## 2018-09-28 NOTE — Telephone Encounter (Signed)
RN call patient that his MRA head did not show any concerns or reasoning behind his symptoms. PT verbalized understanding. ------

## 2018-09-28 NOTE — Telephone Encounter (Signed)
-----   Message from Derek Poisson, NP sent at 09/28/2018  8:20 AM EST ----- Please advise patient that his recent imaging did not show any concern or reasoning behind his symptoms.

## 2018-10-11 DIAGNOSIS — I10 Essential (primary) hypertension: Secondary | ICD-10-CM | POA: Diagnosis not present

## 2018-10-12 DIAGNOSIS — G459 Transient cerebral ischemic attack, unspecified: Secondary | ICD-10-CM | POA: Diagnosis not present

## 2018-10-19 ENCOUNTER — Other Ambulatory Visit: Payer: Self-pay

## 2018-10-19 ENCOUNTER — Emergency Department (HOSPITAL_COMMUNITY): Payer: 59

## 2018-10-19 ENCOUNTER — Encounter (HOSPITAL_COMMUNITY): Payer: Self-pay | Admitting: Internal Medicine

## 2018-10-19 ENCOUNTER — Observation Stay (HOSPITAL_COMMUNITY)
Admission: EM | Admit: 2018-10-19 | Discharge: 2018-10-19 | Disposition: A | Discharge status: Home / Self Care | Payer: 59 | Attending: Internal Medicine | Admitting: Internal Medicine

## 2018-10-19 DIAGNOSIS — F329 Major depressive disorder, single episode, unspecified: Secondary | ICD-10-CM | POA: Diagnosis not present

## 2018-10-19 DIAGNOSIS — I129 Hypertensive chronic kidney disease with stage 1 through stage 4 chronic kidney disease, or unspecified chronic kidney disease: Secondary | ICD-10-CM | POA: Diagnosis not present

## 2018-10-19 DIAGNOSIS — N182 Chronic kidney disease, stage 2 (mild): Secondary | ICD-10-CM | POA: Insufficient documentation

## 2018-10-19 DIAGNOSIS — Z8673 Personal history of transient ischemic attack (TIA), and cerebral infarction without residual deficits: Secondary | ICD-10-CM | POA: Diagnosis not present

## 2018-10-19 DIAGNOSIS — Z6841 Body Mass Index (BMI) 40.0 and over, adult: Secondary | ICD-10-CM | POA: Insufficient documentation

## 2018-10-19 DIAGNOSIS — M109 Gout, unspecified: Secondary | ICD-10-CM | POA: Diagnosis not present

## 2018-10-19 DIAGNOSIS — G4700 Insomnia, unspecified: Secondary | ICD-10-CM | POA: Insufficient documentation

## 2018-10-19 DIAGNOSIS — Z7984 Long term (current) use of oral hypoglycemic drugs: Secondary | ICD-10-CM | POA: Insufficient documentation

## 2018-10-19 DIAGNOSIS — R079 Chest pain, unspecified: Secondary | ICD-10-CM | POA: Diagnosis not present

## 2018-10-19 DIAGNOSIS — R0902 Hypoxemia: Secondary | ICD-10-CM | POA: Diagnosis not present

## 2018-10-19 DIAGNOSIS — R9431 Abnormal electrocardiogram [ECG] [EKG]: Secondary | ICD-10-CM | POA: Insufficient documentation

## 2018-10-19 DIAGNOSIS — Z79899 Other long term (current) drug therapy: Secondary | ICD-10-CM | POA: Diagnosis not present

## 2018-10-19 DIAGNOSIS — E876 Hypokalemia: Secondary | ICD-10-CM | POA: Diagnosis not present

## 2018-10-19 DIAGNOSIS — N400 Enlarged prostate without lower urinary tract symptoms: Secondary | ICD-10-CM | POA: Diagnosis not present

## 2018-10-19 DIAGNOSIS — I10 Essential (primary) hypertension: Secondary | ICD-10-CM | POA: Diagnosis not present

## 2018-10-19 DIAGNOSIS — E66813 Obesity, class 3: Secondary | ICD-10-CM | POA: Diagnosis present

## 2018-10-19 DIAGNOSIS — E1122 Type 2 diabetes mellitus with diabetic chronic kidney disease: Secondary | ICD-10-CM | POA: Insufficient documentation

## 2018-10-19 DIAGNOSIS — R0789 Other chest pain: Principal | ICD-10-CM | POA: Insufficient documentation

## 2018-10-19 DIAGNOSIS — Z8249 Family history of ischemic heart disease and other diseases of the circulatory system: Secondary | ICD-10-CM | POA: Diagnosis not present

## 2018-10-19 DIAGNOSIS — E785 Hyperlipidemia, unspecified: Secondary | ICD-10-CM | POA: Diagnosis not present

## 2018-10-19 DIAGNOSIS — Z7982 Long term (current) use of aspirin: Secondary | ICD-10-CM | POA: Insufficient documentation

## 2018-10-19 DIAGNOSIS — E119 Type 2 diabetes mellitus without complications: Secondary | ICD-10-CM | POA: Diagnosis not present

## 2018-10-19 LAB — CBC WITH DIFFERENTIAL/PLATELET
Abs Immature Granulocytes: 0.02 10*3/uL (ref 0.00–0.07)
Basophils Absolute: 0 10*3/uL (ref 0.0–0.1)
Basophils Relative: 0 %
Eosinophils Absolute: 0.1 10*3/uL (ref 0.0–0.5)
Eosinophils Relative: 1 %
HCT: 42.5 % (ref 39.0–52.0)
Hemoglobin: 14.7 g/dL (ref 13.0–17.0)
Immature Granulocytes: 0 %
Lymphocytes Relative: 33 %
Lymphs Abs: 2.9 10*3/uL (ref 0.7–4.0)
MCH: 28.9 pg (ref 26.0–34.0)
MCHC: 34.6 g/dL (ref 30.0–36.0)
MCV: 83.5 fL (ref 80.0–100.0)
Monocytes Absolute: 0.6 10*3/uL (ref 0.1–1.0)
Monocytes Relative: 7 %
Neutro Abs: 5.3 10*3/uL (ref 1.7–7.7)
Neutrophils Relative %: 59 %
Platelets: 212 10*3/uL (ref 150–400)
RBC: 5.09 MIL/uL (ref 4.22–5.81)
RDW: 12.2 % (ref 11.5–15.5)
WBC: 9 10*3/uL (ref 4.0–10.5)
nRBC: 0 % (ref 0.0–0.2)

## 2018-10-19 LAB — BASIC METABOLIC PANEL
Anion gap: 11 (ref 5–15)
BUN: 14 mg/dL (ref 6–20)
CO2: 30 mmol/L (ref 22–32)
Calcium: 9.2 mg/dL (ref 8.9–10.3)
Chloride: 99 mmol/L (ref 98–111)
Creatinine, Ser: 1.27 mg/dL — ABNORMAL HIGH (ref 0.61–1.24)
GFR calc Af Amer: >60 mL/min (ref >60)
GFR calc non Af Amer: >60 mL/min (ref >60)
Glucose, Bld: 228 mg/dL — ABNORMAL HIGH (ref 70–99)
Potassium: 2.7 mmol/L — CL (ref 3.5–5.1)
Sodium: 140 mmol/L (ref 135–145)

## 2018-10-19 LAB — MAGNESIUM: Magnesium: 1.9 mg/dL (ref 1.7–2.4)

## 2018-10-19 LAB — TROPONIN I: Troponin I: <0.03 ng/mL (ref <0.03)

## 2018-10-19 MED ORDER — ACETAMINOPHEN 650 MG RE SUPP: 650.0000 mg | Freq: Four times a day (QID) | RECTAL | Status: DC | PRN

## 2018-10-19 MED ORDER — POTASSIUM CHLORIDE CRYS ER 20 MEQ PO TBCR
60.0000 meq | EXTENDED_RELEASE_TABLET | Freq: Once | ORAL | Status: AC
  Administered 2018-10-19: 60 meq via ORAL
  Filled 2018-10-19: qty 3

## 2018-10-19 MED ORDER — ESCITALOPRAM OXALATE 10 MG PO TABS: 10.0000 mg | ORAL_TABLET | Freq: Every day | ORAL | Status: DC

## 2018-10-19 MED ORDER — POTASSIUM CHLORIDE ER 20 MEQ PO TBCR: 40.0000 meq | EXTENDED_RELEASE_TABLET | Freq: Every day | ORAL | 0 refills | Status: DC

## 2018-10-19 MED ORDER — HYDRALAZINE HCL 20 MG/ML IJ SOLN: 10.0000 mg | Freq: Three times a day (TID) | INTRAMUSCULAR | Status: DC | PRN

## 2018-10-19 MED ORDER — ENOXAPARIN SODIUM 40 MG/0.4ML ~~LOC~~ SOLN: 40.0000 mg | SUBCUTANEOUS | Status: DC

## 2018-10-19 MED ORDER — TAMSULOSIN HCL 0.4 MG PO CAPS: 0.4000 mg | ORAL_CAPSULE | Freq: Every day | ORAL | Status: DC

## 2018-10-19 MED ORDER — ALLOPURINOL 300 MG PO TABS: 300.0000 mg | ORAL_TABLET | Freq: Every day | ORAL | Status: DC

## 2018-10-19 MED ORDER — AMLODIPINE BESYLATE 10 MG PO TABS: 10.0000 mg | ORAL_TABLET | Freq: Every day | ORAL | Status: DC

## 2018-10-19 MED ORDER — ALBUTEROL SULFATE (2.5 MG/3ML) 0.083% IN NEBU: 2.5000 mg | INHALATION_SOLUTION | RESPIRATORY_TRACT | Status: DC | PRN

## 2018-10-19 MED ORDER — TRAZODONE HCL 50 MG PO TABS: 150.0000 mg | ORAL_TABLET | Freq: Every day | ORAL | Status: DC

## 2018-10-19 MED ORDER — HYDROCHLOROTHIAZIDE 25 MG PO TABS: 12.5000 mg | ORAL_TABLET | Freq: Every day | ORAL | Status: DC

## 2018-10-19 MED ORDER — TELMISARTAN-HCTZ 80-12.5 MG PO TABS: 1.0000 | ORAL_TABLET | Freq: Every day | ORAL | Status: DC

## 2018-10-19 MED ORDER — CARVEDILOL 12.5 MG PO TABS: 12.5000 mg | ORAL_TABLET | Freq: Two times a day (BID) | ORAL | Status: DC

## 2018-10-19 MED ORDER — BUPROPION HCL ER (XL) 150 MG PO TB24: 150.0000 mg | ORAL_TABLET | Freq: Every day | ORAL | Status: DC

## 2018-10-19 MED ORDER — ACETAMINOPHEN 325 MG PO TABS: 650.0000 mg | ORAL_TABLET | Freq: Four times a day (QID) | ORAL | Status: DC | PRN

## 2018-10-19 MED ORDER — INSULIN ASPART 100 UNIT/ML ~~LOC~~ SOLN: 0.0000 [IU] | Freq: Three times a day (TID) | SUBCUTANEOUS | Status: DC

## 2018-10-19 MED ORDER — INSULIN ASPART 100 UNIT/ML ~~LOC~~ SOLN: 0.0000 [IU] | Freq: Every day | SUBCUTANEOUS | Status: DC

## 2018-10-19 MED ORDER — HYDROCHLOROTHIAZIDE 12.5 MG PO CAPS: 12.5000 mg | ORAL_CAPSULE | Freq: Every day | ORAL | Status: DC

## 2018-10-19 MED ORDER — ASPIRIN 325 MG PO TABS: 325.0000 mg | ORAL_TABLET | Freq: Every day | ORAL | Status: DC

## 2018-10-19 MED ORDER — IRBESARTAN 300 MG PO TABS: 300.0000 mg | ORAL_TABLET | Freq: Every day | ORAL | Status: DC

## 2018-10-19 MED ORDER — NITROGLYCERIN 0.4 MG SL SUBL: 0.4000 mg | SUBLINGUAL_TABLET | SUBLINGUAL | Status: DC | PRN

## 2018-10-19 MED ORDER — SODIUM CHLORIDE 0.9% FLUSH: 3.0000 mL | Freq: Two times a day (BID) | INTRAVENOUS | Status: DC

## 2018-10-19 MED ORDER — ROSUVASTATIN CALCIUM 5 MG PO TABS: 5.0000 mg | ORAL_TABLET | ORAL | Status: DC

## 2018-10-19 NOTE — ED Triage Notes (Signed)
Pt BIB [facility]. Pt reports chest tightness on the left side of his chest for about the past two days. Pt went to see his PCP today for a blood pressure check and was sent here due to his complaint of chest tightness. Pt was given one nitro at the doctor's office and EMS gave patient one additional nitro and 324 of aspirin. Pt complains of 1/10 chest tightness at this time. Denies any shortness of breath.

## 2018-10-19 NOTE — Discharge Summary (Signed)
Physician Discharge Summary  Derek Duarte TSV:[id number] DOB: 05/11/1961  PCP: Lawerance Cruel, MD  Admit date: 10/19/2018 Discharge date: 10/19/2018  Recommendations for Outpatient Follow-up:  1. Dr. Lawerance Cruel, PCP in 3 days with repeat labs (BMP, magnesium and EKG) 2. Beckie Busing, NP/Cardiology on 10/29/2018 at 10:30 AM.  Home Health: None Equipment/Devices: None  Discharge Condition: Improved and stable CODE STATUS: Full Diet recommendation: Heart healthy and diabetic diet.  Discharge Diagnoses:  Principal Problem:   Chest pain Active Problems:   Type 2 diabetes mellitus without complication (HCC)   Essential hypertension   Obesity, Class III, BMI 40-49.9 (morbid obesity) ([place])   History of TIA (transient ischemic attack)   Brief Summary: Derek Duarte is a pleasant 58 year old married male, works in the [employer], independent but mostly sedentary, PMH of longstanding difficult to control HTN, DM 2, HLD, TIA, low testosterone, depression, gout, BPH and insomnia presented to [facility] ED on 10/19/2018 with above complaints.  Patient reports that about 2 days ago he started experiencing left-sided chest pain, able to localize with 2 fingers, felt like a "stitch", rated at 3/10 in intensity, nonradiating, not associated with dyspnea, nausea or sweating, not exertional in nature, not worsened by chest wall movements or deep breaths, not reproducible to palpation.  No recent heavy lifting or trauma.  He had a scheduled PCP visit today and he mentioned this to Dr. Harrington Challenger.  An EKG was performed and apparent abnormality was noted.  EMS was called and patient was sent to the ED for evaluation.  He received 324 mg of aspirin by EMS and a sublingual NTG.  He received a second sublingual NTG in ED and the chest pain improved from 3/10 to 1/10.  He denies prior history of such chest pains, no prior MI or CAD, has never done stress test or cardiac cath.  He reports that  his mother had A. fib but no history of coronary artery disease in family.  No history of recent long distance travel.  ED Course: Lab work significant for potassium 2.7, creatinine 1.27 and glucose 228.  He received oral potassium supplement 60 mEq x 1 dose.  Chest x-ray without acute abnormalities.  Assessment and plan:   1. Atypical chest pain: Patient does have CAD risk factors including DM 2, HTN, HLD, morbid obesity.  EKG x2 without acute changes.  Troponin x1: Negative. Received aspirin 324 mg by EMS.  TTE 09/07/2018: LVEF 55-60%, severe LVH, grade 1 diastolic dysfunction.  Patient was briefly admitted to observation floor from ED.  Cardiology consulted and agree that he has very atypical chest pain and that his symptoms are not consistent with ischemic etiology.  They do not feel that he needs additional cardiac work-up at this time and cleared for discharge home from cardiac standpoint.  They have arranged outpatient follow-up.  If he continues to have chest pain further down the road, then outpatient work-up will be considered. 2. Essential hypertension: Continue prior home medications: HCTZ 12.5 mg daily, Micardis HCTZ 80-12.5 mg daily, carvedilol 12.5 mg twice daily and amlodipine 10 mg daily.    Patient received potassium supplements 60 Meq in the ED.  His HCTZ dose was reportedly recently increased.  Given severity of hypokalemia on admission and changing HCTZ dose, initiated Kdur 40 meq daily from tomorrow.  Close outpatient follow-up with PCP with BMP early next week. 3. Type II DM in obese:  Continue prior home oral hypoglycemics and outpatient follow-up with PCP. 4. Hyperlipidemia: Continue  Crestor. 5. Morbid obesity/Body mass index is 40.28 kg/m.:  Would need to diet exercise and lose weight. 6. Hypokalemia:  Replaced as indicated above.  Magnesium 1.9. 7. Prolonged QTC: QTC 534 ms on admission.  May be related to hypokalemia, replaced.    Follow EKG closely as  outpatient. 8. Stage II chronic kidney disease: Presented with creatinine of 1.27.  Baseline creatinine not known.  It was 0.84 in 2013.  Patient follow-up with PCP. 9. Depression: Stable.  Continue prior home medications. 10. Possible TIA history: Recently seen by cardiology on 09/20/2018 and that cardiologist has since retired.  Also seen by neurology on 12/3 for transient episode of dizziness and left upper extremity incoordination and weakness likely posterior circulation TIA versus small brainstem infarct not visualized on MRI.  Plan is to follow back in 3 months with repeat MRA neck and brain, TTE and sleep study.  TTE results as above.  Carotid Doppler 08/13/2018: Normal   Consultations:  Cardiology  Procedures:  None   Discharge Instructions  Discharge Instructions    Call MD for:  difficulty breathing, headache or visual disturbances   Complete by:  As directed    Call MD for:  extreme fatigue   Complete by:  As directed    Call MD for:  persistant dizziness or light-headedness   Complete by:  As directed    Call MD for:  severe uncontrolled pain   Complete by:  As directed    Diet - low sodium heart healthy   Complete by:  As directed    Diet Carb Modified   Complete by:  As directed    Increase activity slowly   Complete by:  As directed        Medication List    TAKE these medications   allopurinol 300 MG tablet Commonly known as:  ZYLOPRIM Take 300 mg by mouth daily.   amLODipine 10 MG tablet Commonly known as:  NORVASC Take 10 mg by mouth daily.   aspirin 325 MG tablet Take 325 mg by mouth daily.   buPROPion 150 MG 24 hr tablet Commonly known as:  WELLBUTRIN XL Take 150 mg by mouth daily.   carvedilol 12.5 MG tablet Commonly known as:  COREG Take 12.5 mg by mouth 2 (two) times daily.   escitalopram 10 MG tablet Commonly known as:  LEXAPRO Take 10 mg by mouth daily.   glimepiride 4 MG tablet Commonly known as:  AMARYL Take 4 mg by mouth 2  (two) times daily.   hydrochlorothiazide 12.5 MG tablet Commonly known as:  HYDRODIURIL Take 12.5 mg by mouth daily.   metFORMIN 500 MG tablet Commonly known as:  GLUCOPHAGE Take 500 mg by mouth 2 (two) times daily with a meal.   Potassium Chloride ER 20 MEQ Tbcr Take 40 mEq by mouth daily. Start taking on:  October 20, 2018   rosuvastatin 5 MG tablet Commonly known as:  CRESTOR Take 5 mg by mouth every Monday, Wednesday, and Friday.   tamsulosin 0.4 MG Caps capsule Commonly known as:  FLOMAX Take 0.4 mg by mouth daily.   telmisartan-hydrochlorothiazide 80-12.5 MG tablet Commonly known as:  MICARDIS HCT Take 1 tablet by mouth daily.   testosterone cypionate 200 MG/ML injection Commonly known as:  DEPOTESTOSTERONE CYPIONATE Inject 100 mg into the muscle every 14 (fourteen) days.   traZODone 150 MG tablet Commonly known as:  DESYREL Take 150 mg by mouth at bedtime.      Follow-up Information  Lendon Colonel, NP Follow up on 10/29/2018.   Specialties:  Nurse Practitioner, Radiology, Cardiology Why:  @ 10:30 AM with Dr. Doug Sou NP Contact information: [address] [place] [address] [phone]        Lawerance Cruel, MD. Schedule an appointment as soon as possible for a visit in 3 day(s).   Specialty:  Family Medicine Why:  To be seen with repeat labs (BMP, magnesium and repeat EKG). Contact information: [address] [place] [address] [phone]          No Known Allergies    Procedures/Studies: Dg Chest 2 View  Result Date: 10/19/2018 CLINICAL DATA:  Left upper chest pain for 1 and half days. EXAM: CHEST - 2 VIEW COMPARISON:  October 23, 2007 FINDINGS: The heart size and mediastinal contours are within normal limits. Both lungs are clear. The visualized skeletal structures are unremarkable. IMPRESSION: No active cardiopulmonary disease. Electronically Signed   By: Dorise Bullion III M.D   On: 10/19/2018 12:33      Subjective: Patient was just recently seen in ED at which time his chest pain had resolved and he had no other complaints.  Discharge Exam:  Vitals:   10/19/18 1315 10/19/18 1330 10/19/18 1345 10/19/18 1426  BP: (!) 164/91 (!) 156/89 (!) 166/93 (!) 155/86  Pulse: (!) 46 (!) 57 (!) 58 (!) 58  Resp: 19 (!) 26 10 14   Temp:      TempSrc:      SpO2: 100% 100% 98% 98%  Weight:      Height:        General: Pleasant young male, moderately built and morbidly obese lying comfortably propped up in bed. Cardiovascular: S1 & S2 heard, RRR, S1/S2 +. No murmurs, rubs, gallops or clicks. No JVD or pedal edema.  Telemetry personally reviewed: Sinus rhythm. Respiratory: Clear to auscultation without wheezing, rhonchi or crackles. No increased work of breathing.  No reproducible chest wall tenderness. Abdominal:  Non distended, non tender & soft. No organomegaly or masses appreciated. Normal bowel sounds heard. CNS: Alert and oriented. No focal deficits. Extremities: no edema, no cyanosis    The results of significant diagnostics from this hospitalization (including imaging, microbiology, ancillary and laboratory) are listed below for reference.     Labs: CBC: Recent Labs  Lab 10/19/18 1152  WBC 9.0  NEUTROABS 5.3  HGB 14.7  HCT 42.5  MCV 83.5  PLT 888   Basic Metabolic Panel: Recent Labs  Lab 10/19/18 1152  NA 140  K 2.7*  CL 99  CO2 30  GLUCOSE 228*  BUN 14  CREATININE 1.27*  CALCIUM 9.2  MG 1.9   Cardiac Enzymes: Recent Labs  Lab 10/19/18 1152  TROPONINI <0.03    Time coordinating discharge: 20 minutes  SIGNED:  Vernell Leep, MD, FACP, [facility]. [facility]  To contact the attending provider between 7A-7P or the covering provider during after hours 7P-7A, please log into the web site [email or web address] and access using universal [place] password for that web site. If you do not have the password, please call the hospital operator.

## 2018-10-19 NOTE — H&P (Signed)
History and Physical    Derek Duarte HWE:[id number] DOB: 1960/11/26 DOA: 10/19/2018  PCP: Lawerance Cruel, MD   I have briefly reviewed patients previous medical reports in [facility].  Patient coming from: Home via PCPs office  Chief Complaint: Chest pain  HPI: Derek Duarte is a pleasant 58 year old married male, works in the [employer], independent but mostly sedentary, PMH of longstanding difficult to control HTN, DM 2, HLD, TIA, low testosterone, depression, gout, BPH and insomnia presented to [facility] ED on 10/19/2018 with above complaints.  Patient reports that about 2 days ago he started experiencing left-sided chest pain, able to localize with 2 fingers, felt like a "stitch", rated at 3/10 in intensity, nonradiating, not associated with dyspnea, nausea or sweating, not exertional in nature, not worsened by chest wall movements or deep breaths, not reproducible to palpation.  No recent heavy lifting or trauma.  He had a scheduled PCP visit today and he mentioned this to Dr. Harrington Challenger.  An EKG was performed and apparent abnormality was noted.  EMS was called and patient was sent to the ED for evaluation.  He received 324 mg of aspirin by EMS and a sublingual NTG.  He received a second sublingual NTG in ED and the chest pain improved from 3/10 to 1/10.  He denies prior history of such chest pains, no prior MI or CAD, has never done stress test or cardiac cath.  He reports that his mother had A. fib but no history of coronary artery disease in family.  No history of recent long distance travel.  ED Course: Lab work significant for potassium 2.7, creatinine 1.27 and glucose 228.  He received oral potassium supplement 60 mEq x 1 dose.  Chest x-ray without acute abnormalities.  Review of Systems:  All other systems reviewed and apart from HPI, are negative.  Past Medical History:  Diagnosis Date  . BPH (benign prostatic hyperplasia)   . Depression   . Diabetes mellitus  without complication ([place])   . Gout   . Hyperlipidemia   . Hypertension   . Insomnia   . Low testosterone     Past Surgical History:  Procedure Laterality Date  . HERNIA REPAIR      Social History  reports that he has never smoked. He has never used smokeless tobacco. He reports current alcohol use of about 2.0 standard drinks of alcohol per week. He reports previous drug use.  No Known Allergies  Family History  Problem Relation Age of Onset  . Hypertension Other      Prior to Admission medications   Medication Sig Start Date End Date Taking? Authorizing Provider  allopurinol (ZYLOPRIM) 300 MG tablet Take 300 mg by mouth daily.   Yes [provider]  amLODipine (NORVASC) 10 MG tablet Take 10 mg by mouth daily.   Yes [provider]  aspirin 325 MG tablet Take 325 mg by mouth daily.   Yes [provider]  buPROPion (WELLBUTRIN XL) 150 MG 24 hr tablet Take 150 mg by mouth daily.  05/23/18  Yes [provider]  carvedilol (COREG) 12.5 MG tablet Take 12.5 mg by mouth 2 (two) times daily. 06/04/18  Yes [provider]  escitalopram (LEXAPRO) 10 MG tablet Take 10 mg by mouth daily. 04/15/16  Yes [provider]  glimepiride (AMARYL) 4 MG tablet Take 4 mg by mouth 2 (two) times daily. 02/07/16  Yes [provider]  hydrochlorothiazide (HYDRODIURIL) 12.5 MG tablet Take 12.5 mg by  mouth daily. 10/11/18  Yes [provider]  metFORMIN (GLUCOPHAGE) 500 MG tablet Take 500 mg by mouth 2 (two) times daily with a meal.   Yes [provider]  rosuvastatin (CRESTOR) 5 MG tablet Take 5 mg by mouth every Monday, Wednesday, and Friday.    Yes [provider]  tamsulosin (FLOMAX) 0.4 MG CAPS capsule Take 0.4 mg by mouth daily. 06/28/18  Yes [provider]  telmisartan-hydrochlorothiazide (MICARDIS HCT) 80-12.5 MG tablet Take 1 tablet by mouth daily. 09/20/18  Yes [provider]  testosterone  cypionate (DEPOTESTOSTERONE CYPIONATE) 200 MG/ML injection Inject 100 mg into the muscle every 14 (fourteen) days.  06/19/18  Yes [provider]  traZODone (DESYREL) 150 MG tablet Take 150 mg by mouth at bedtime. 03/15/16  Yes [provider]    Physical Exam: Vitals:   10/19/18 1300 10/19/18 1315 10/19/18 1330 10/19/18 1345  BP: (!) 157/91 (!) 164/91 (!) 156/89 (!) 166/93  Pulse: (!) 59 (!) 46 (!) 57 (!) 58  Resp: 17 19 (!) 26 10  Temp:      TempSrc:      SpO2: 98% 100% 100% 98%  Weight:      Height:          Constitutional: Pleasant young male, moderately built and morbidly obese, lying comfortably propped up in bed without distress. Eyes: PERTLA, lids and conjunctivae normal ENMT: Mucous membranes are moist. Posterior pharynx clear of any exudate or lesions. Normal dentition.  Neck: supple, no masses, no thyromegaly Respiratory: clear to auscultation bilaterally, no wheezing, no crackles. Normal respiratory effort. No accessory muscle use.  No reproducible chest wall tenderness. Cardiovascular: S1 & S2 heard, regular rate and rhythm, no murmurs / rubs / gallops. No extremity edema. 2+ pedal pulses. No carotid bruits.  Abdomen: No distension, no tenderness, no masses palpated. No hepatosplenomegaly. Bowel sounds normal.  Musculoskeletal: no clubbing / cyanosis. No joint deformity upper and lower extremities. Good ROM, no contractures. Normal muscle tone.  Skin: no rashes, lesions, ulcers. No induration Neurologic: CN 2-12 grossly intact. Sensation intact, DTR normal. Strength 5/5 in all 4 limbs.  Psychiatric: Normal judgment and insight. Alert and oriented x 3. Normal mood.     Labs on Admission: I have personally reviewed following labs and imaging studies  CBC: Recent Labs  Lab 10/19/18 1152  WBC 9.0  NEUTROABS 5.3  HGB 14.7  HCT 42.5  MCV 83.5  PLT 725   Basic Metabolic Panel: Recent Labs  Lab 10/19/18 1152  NA 140  K 2.7*  CL 99  CO2 30    GLUCOSE 228*  BUN 14  CREATININE 1.27*  CALCIUM 9.2  MG 1.9   Cardiac Enzymes: Recent Labs  Lab 10/19/18 1152  TROPONINI <0.03     Radiological Exams on Admission: Dg Chest 2 View  Result Date: 10/19/2018 CLINICAL DATA:  Left upper chest pain for 1 and half days. EXAM: CHEST - 2 VIEW COMPARISON:  October 23, 2007 FINDINGS: The heart size and mediastinal contours are within normal limits. Both lungs are clear. The visualized skeletal structures are unremarkable. IMPRESSION: No active cardiopulmonary disease. Electronically Signed   By: Dorise Bullion III M.D   On: 10/19/2018 12:33    EKG: Independently reviewed.  Sinus rhythm at 60 bpm, normal axis, Q waves but only in lead III, slow R wave progression but no acute findings and QTC 534 ms.  I personally reviewed EKG copy sent from PCPs office which also did not show any  acute changes.  Assessment/Plan Principal Problem:   Chest pain Active Problems:   Type 2 diabetes mellitus without complication (HCC)   Essential hypertension   Obesity, Class III, BMI 40-49.9 (morbid obesity) ([place])   History of TIA (transient ischemic attack)     1. Atypical chest pain: Patient does have CAD risk factors including DM 2, HTN, HLD, morbid obesity.  EKG x2 without acute changes.  Troponin x1: Negative.  Observe on telemetry, cycle troponin, has already received aspirin 324 mg by EMS.  PRN sublingual NTG.  Cardiology consulted for further evaluation.  TTE 09/07/2018: LVEF 55-60%, severe LVH, grade 1 diastolic dysfunction. 2. Essential hypertension: Continue prior home medications: HCTZ 12.5 mg daily, Micardis HCTZ 80-12.5 mg daily, carvedilol 12.5 mg twice daily and amlodipine 10 mg daily.  May consider PRN IV hydralazine. 3. Type II DM in obese: Hold oral hypoglycemics.  Treat with SSI for now. 4. Hyperlipidemia: Continue Crestor. 5. Morbid obesity/Body mass index is 40.28 kg/m.:  Would need to diet exercise and lose weight. 6. Hypokalemia:  Replace and follow.  Magnesium 1.9. 7. Prolonged QTC: QTC 534 ms on admission.  May be related to hypokalemia, replaced.  Monitor on telemetry and follow EKG. 8. Stage II chronic kidney disease: Presented with creatinine of 1.27.  Baseline creatinine not known.  It was 0.84 in 2013. 9. Depression: Stable.  Continue prior home medications. 10. Possible TIA history: Recently seen by cardiology on 09/20/2018 and that cardiologist has since retired.  Also seen by neurology on 12/3 for transient episode of dizziness and left upper extremity incoordination and weakness likely posterior circulation TIA versus small brainstem infarct not visualized on MRI.  Plan is to follow back in 3 months with repeat MRA neck and brain, TTE and sleep study.  TTE results as above.  Carotid Doppler 08/13/2018: Normal   DVT prophylaxis: Lovenox Code Status: Full Family Communication: Discussed in detail with patient spouse at bedside, updated care and answered questions. Disposition Plan: DC home pending clinical improvement and evaluation by cardiology, possibly 1/25. Consults called: Cardiology Admission status: Observation, telemetry.  Severity of Illness: The appropriate patient status for this patient is OBSERVATION. Observation status is judged to be reasonable and necessary in order to provide the required intensity of service to ensure the patient's safety. The patient's presenting symptoms, physical exam findings, and initial radiographic and laboratory data in the context of their medical condition is felt to place them at decreased risk for further clinical deterioration. Furthermore, it is anticipated that the patient will be medically stable for discharge from the hospital within 2 midnights of admission. The following factors support the patient status of observation.   " The patient's presenting symptoms include chest pain. " The physical exam findings include evaded blood pressure. " The initial  radiographic and laboratory data are potassium 2.7, glucose 228, creatinine 1.27, prolonged QTC on EKG but no acute EKG changes.     Vernell Leep MD [facility]  To contact the attending provider between 7A-7P or the covering provider during after hours 7P-7A, please log into the web site [email or web address] and access using universal [place] password for that web site. If you do not have the password, please call the hospital operator.  10/19/2018, 2:26 PM

## 2018-10-19 NOTE — ED Provider Notes (Signed)
[place] EMERGENCY DEPARTMENT Provider Note   CSN: [id number] Arrival date & time: 10/19/18  1041     History   Chief Complaint Chief Complaint  Patient presents with  . Chest Pain    HPI Derek Duarte is a 58 y.o. male.  HPI   58 year old male with chest pain.  The left anterior chest.  Onset about 2 days ago.  Describes pain as "a stitch."  Denies any other associated symptoms.  He went to see his PCP who referred to the emergency room.  He received nitroglycerin in his doctor's office and again with EMS as well as then on 24 mg of aspirin.  He states that he did get improvement of his symptoms with the nitroglycerin.  He reports that he saw cardiologist Dr. Darral Dash 1 month ago.  He denies any known cardiac problems but was seeing him in follow-up after he had some type of " neurologic event" of unclear etiology in November.  Past Medical History:  Diagnosis Date  . BPH (benign prostatic hyperplasia)   . Depression   . Diabetes mellitus without complication ([place])   . Gout   . Hyperlipidemia   . Hypertension   . Insomnia   . Low testosterone     There are no active problems to display for this patient.   Past Surgical History:  Procedure Laterality Date  . HERNIA REPAIR          Home Medications    Prior to Admission medications   Medication Sig Start Date End Date Taking? Authorizing Provider  allopurinol (ZYLOPRIM) 300 MG tablet Take 300 mg by mouth daily.    [provider]  amLODipine (NORVASC) 10 MG tablet Take 10 mg by mouth daily.    [provider]  aspirin 325 MG tablet Take 325 mg by mouth daily.    [provider]  buPROPion (WELLBUTRIN XL) 300 MG 24 hr tablet TAKE 1 TABLET BY MOUTH EVERY DAY IN THE MORNING 05/23/18   [provider]  buPROPion HCl (WELLBUTRIN XL PO) Take 450 mg by mouth.    [provider]  carvedilol (COREG) 12.5 MG tablet Take 12.5 mg by mouth 2 (two) times daily.  06/04/18   [provider]  escitalopram (LEXAPRO) 10 MG tablet Take 10 mg by mouth daily. 04/15/16   [provider]  glimepiride (AMARYL) 4 MG tablet Take 4 mg by mouth 2 (two) times daily. 02/07/16   [provider]  lisinopril-hydrochlorothiazide (PRINZIDE,ZESTORETIC) 10-12.5 MG per tablet Take 1 tablet by mouth daily.    [provider]  metFORMIN (GLUCOPHAGE) 500 MG tablet Take 500 mg by mouth 2 (two) times daily with a meal.    [provider]  rosuvastatin (CRESTOR) 5 MG tablet Take 5 mg by mouth daily.    [provider]  tamsulosin (FLOMAX) 0.4 MG CAPS capsule Take 0.4 mg by mouth daily. 06/28/18   [provider]  testosterone cypionate (DEPOTESTOSTERONE CYPIONATE) 200 MG/ML injection INJECT 0.3MLS INTRAMUSCULARLY WEEKLY 06/19/18   [provider]  traZODone (DESYREL) 150 MG tablet Take 150 mg by mouth at bedtime. 03/15/16   [provider]    Family History Family History  Problem Relation Age of Onset  . Hypertension Other     Social History Social History   Tobacco Use  . Smoking status: Never Smoker  . Smokeless tobacco: Never Used  Substance Use Topics  . Alcohol use: Yes    Alcohol/week: 2.0 standard drinks  Types: 2 Cans of beer per week  . Drug use: Not Currently     Allergies   Patient has no known allergies.   Review of Systems Review of Systems All systems reviewed and negative, other than as noted in HPI.   Physical Exam Updated Vital Signs BP (!) 152/80   Pulse (!) 56   Temp 98 F (36.7 C) (Oral)   Resp 11   Ht 6' (1.829 m)   Wt 134.7 kg   SpO2 96%   BMI 40.28 kg/m   Physical Exam Vitals signs and nursing note reviewed.  Constitutional:      General: He is not in acute distress.    Appearance: He is well-developed. He is obese.  HENT:     Head: Normocephalic and atraumatic.  Eyes:     General:        Right eye: No discharge.        Left eye: No discharge.       Conjunctiva/sclera: Conjunctivae normal.  Neck:     Musculoskeletal: Neck supple.  Cardiovascular:     Rate and Rhythm: Normal rate and regular rhythm.     Heart sounds: Normal heart sounds. No murmur. No friction rub. No gallop.   Pulmonary:     Effort: Pulmonary effort is normal. No respiratory distress.     Breath sounds: Normal breath sounds.  Abdominal:     General: There is no distension.     Palpations: Abdomen is soft.     Tenderness: There is no abdominal tenderness.  Musculoskeletal:        General: No tenderness.  Skin:    General: Skin is warm and dry.  Neurological:     Mental Status: He is alert.  Psychiatric:        Behavior: Behavior normal.        Thought Content: Thought content normal.      ED Treatments / Results  Labs (all labs ordered are listed, but only abnormal results are displayed) Labs Reviewed  BASIC METABOLIC PANEL - Abnormal; Notable for the following components:      Result Value   Potassium 2.7 (*)    Glucose, Bld 228 (*)    Creatinine, Ser 1.27 (*)    All other components within normal limits  CBC WITH DIFFERENTIAL/PLATELET  TROPONIN I  MAGNESIUM    EKG EKG Interpretation  Date/Time:  Friday October 19 2018 10:44:40 EST Ventricular Rate:  60 PR Interval:    QRS Duration: 123 QT Interval:  534 QTC Calculation: 534 R Axis:   59 Text Interpretation:  Sinus rhythm Nonspecific intraventricular conduction delay Non-specific ST-t changes Confirmed by Virgel Manifold [phone]) on 10/19/2018 11:14:52 AM   Radiology Dg Chest 2 View  Result Date: 10/19/2018 CLINICAL DATA:  Left upper chest pain for 1 and half days. EXAM: CHEST - 2 VIEW COMPARISON:  October 23, 2007 FINDINGS: The heart size and mediastinal contours are within normal limits. Both lungs are clear. The visualized skeletal structures are unremarkable. IMPRESSION: No active cardiopulmonary disease. Electronically Signed   By: Dorise Bullion III M.D   On: 10/19/2018 12:33     Procedures Procedures (including critical care time)  Medications Ordered in ED Medications  potassium chloride SA (K-DUR,KLOR-CON) CR tablet 60 mEq (has no administration in time range)     Initial Impression / Assessment and Plan / ED Course  I have reviewed the triage vital signs and the nursing notes.  Pertinent labs & imaging results that were  available during my care of the patient were reviewed by me and considered in my medical decision making (see chart for details).     58 year old male with chest pain.  Seems somewhat atypical given what he reports is fairly constant symptoms for couple days without any concerning associated symptoms or change with exertion.  He did report improvement with nitroglycerin though.  EKG change from prior although the last one I have for comparison purposes is from 2013.  He does report that he had an EKG done by Dr. Darral Dash that he was told that it was "fine" but I do not have this readily available for review.  Even if I consider his history only "slightly suspicious" he still has a heart score 4 given his EKG, age, and >3 risk factors (htn, obesity, hld, dm).  Will discuss with medical service for admission.  I think ultimately he will probably need some type provocative testing.  Hypokalemia noted.  Will begin supplementation.  We will check a mag level.  Final Clinical Impressions(s) / ED Diagnoses   Final diagnoses:  Chest pain, unspecified type    ED Discharge Orders    None       Virgel Manifold, MD 10/19/18 1312

## 2018-10-19 NOTE — Consult Note (Addendum)
Cardiology Consultation:   Patient ID: Derek Duarte MRN: [id number]; DOB: [birth year]  Admit date: 10/19/2018 Date of Consult: 10/19/2018  Primary Care Provider: Lawerance Cruel, MD Primary Cardiologist: Derek Duarte @ [facility] Primary Electrophysiologist:  None   Patient Profile:   Derek Duarte is a 58 y.o. male with a hx of hypertension, diabetes and TIA who was sent to the ED by his PCP for chest pain and abnormal EKG.  He is being seen today for evaluation of chest pain at the request of Derek Duarte internal medicine.   History of Present Illness:   Derek Duarte is originally from [place].  He has no prior cardiac history. His cardiac risk factors include longstanding HTN, HLD and diabetes. He is a non smoker and no family h/o CAD but he does report that his mother has atrial fibrillation and has undergone ablation.  He recently underwent evaluation for TIA. Carotid dopplers were negative. Echo showed normal LVEF. No cardiac source of emboli was indentified. He was referred to outpatient cardiology and saw Derek Duarte at [facility] and set up w/ 7-day ZIO patch to r/o afib.  He has turned monitor in but has not been notified yet of results.  He reports that over the last several weeks he has been dealing with higher blood pressure readings above his usual baseline.  He went to see his PCP, Derek Duarte earlier today for evaluation.  At that time he did mention that he was experiencing some new left upper chest pain that has been fairly constant for the last 2 to 3 days.  Subsequently Derek Duarte ordered an EKG that was felt to be abnormal.  Subsequently he referred him to the [facility] emergency department.  The patient states that his chest discomfort has been fairly constant for last 2 to 3 days.  It does not radiate.  Feels like a dull ache.  No exacerbating factors.  It is not worse with exertion.  Nonpleuritic.  He has stairs at home that he goes up and down each day and denies any  worsening symptoms with activity.  No associated dyspnea.  No nausea vomiting diarrhea.  He has been dealing recently with some dizziness but he attributes this to vertigo.  The pain is also not reproducible with palpation of the chest wall.  He denies any recent heavy lifting, extreme physical activity, injury or falls.  Reports that he was given some nitroglycerin at Derek Duarte office which eased off the discomfort a little but it returned.  He was given another dose of SL nitro in the emergency department with no change of symptoms.  In the ED troponin I negative x1. EKG shows NSR 60 bpm. Nonspecific ST abnormalities. CBC WNL. BMP notable for hypokalemia at 2.7. Mg normal at 1.9.  Chest x-ray negative     Past Medical History:  Diagnosis Date  . BPH (benign prostatic hyperplasia)   . Depression   . Diabetes mellitus without complication ([place])   . Gout   . Hyperlipidemia   . Hypertension   . Insomnia   . Low testosterone     Past Surgical History:  Procedure Laterality Date  . HERNIA REPAIR       Home Medications:  Prior to Admission medications   Medication Sig Start Date End Date Taking? Authorizing Provider  allopurinol (ZYLOPRIM) 300 MG tablet Take 300 mg by mouth daily.   Yes [provider]  amLODipine (NORVASC) 10 MG tablet Take 10 mg by mouth daily.  Yes [provider]  aspirin 325 MG tablet Take 325 mg by mouth daily.   Yes [provider]  buPROPion (WELLBUTRIN XL) 150 MG 24 hr tablet Take 150 mg by mouth daily.  05/23/18  Yes [provider]  carvedilol (COREG) 12.5 MG tablet Take 12.5 mg by mouth 2 (two) times daily. 06/04/18  Yes [provider]  escitalopram (LEXAPRO) 10 MG tablet Take 10 mg by mouth daily. 04/15/16  Yes [provider]  glimepiride (AMARYL) 4 MG tablet Take 4 mg by mouth 2 (two) times daily. 02/07/16  Yes [provider]  hydrochlorothiazide (HYDRODIURIL) 12.5 MG tablet Take 12.5 mg by  mouth daily. 10/11/18  Yes [provider]  metFORMIN (GLUCOPHAGE) 500 MG tablet Take 500 mg by mouth 2 (two) times daily with a meal.   Yes [provider]  rosuvastatin (CRESTOR) 5 MG tablet Take 5 mg by mouth every Monday, Wednesday, and Friday.    Yes [provider]  tamsulosin (FLOMAX) 0.4 MG CAPS capsule Take 0.4 mg by mouth daily. 06/28/18  Yes [provider]  telmisartan-hydrochlorothiazide (MICARDIS HCT) 80-12.5 MG tablet Take 1 tablet by mouth daily. 09/20/18  Yes [provider]  testosterone cypionate (DEPOTESTOSTERONE CYPIONATE) 200 MG/ML injection Inject 100 mg into the muscle every 14 (fourteen) days.  06/19/18  Yes [provider]  traZODone (DESYREL) 150 MG tablet Take 150 mg by mouth at bedtime. 03/15/16  Yes [provider]    Inpatient Medications: Scheduled Meds:  Continuous Infusions:  PRN Meds:   Allergies:   No Known Allergies  Social History:   Social History   Socioeconomic History  . Marital status: Married    Spouse name: Not on file  . Number of children: Not on file  . Years of education: Not on file  . Highest education level: Not on file  Occupational History  . Not on file  Social Needs  . Financial resource strain: Not on file  . Food insecurity:    Worry: Not on file    Inability: Not on file  . Transportation needs:    Medical: Not on file    Non-medical: Not on file  Tobacco Use  . Smoking status: Never Smoker  . Smokeless tobacco: Never Used  Substance and Sexual Activity  . Alcohol use: Yes    Alcohol/week: 2.0 standard drinks    Types: 2 Cans of beer per week  . Drug use: Not Currently  . Sexual activity: Not on file  Lifestyle  . Physical activity:    Days per week: Not on file    Minutes per session: Not on file  . Stress: Not on file  Relationships  . Social connections:    Talks on phone: Not on file    Gets together: Not on file    Attends religious  service: Not on file    Active member of club or organization: Not on file    Attends meetings of clubs or organizations: Not on file    Relationship status: Not on file  . Intimate partner violence:    Fear of current or ex partner: Not on file    Emotionally abused: Not on file    Physically abused: Not on file    Forced sexual activity: Not on file  Other Topics Concern  . Not on file  Social History Narrative  . Not on file    Family History:    Family History  Problem Relation Age of  Onset  . Hypertension Other   . Atrial fibrillation Mother        had ablation     ROS:  Please see the history of present illness.   All other ROS reviewed and negative.     Physical Exam/Data:   Vitals:   10/19/18 1315 10/19/18 1330 10/19/18 1345 10/19/18 1426  BP: (!) 164/91 (!) 156/89 (!) 166/93 (!) 155/86  Pulse: (!) 46 (!) 57 (!) 58 (!) 58  Resp: 19 (!) 26 10 14   Temp:      TempSrc:      SpO2: 100% 100% 98% 98%  Weight:      Height:       No intake or output data in the 24 hours ending 10/19/18 1506 Last 3 Weights 10/19/2018 08/28/2018 06/06/2013  Weight (lbs) 297 lb 304 lb 9.6 oz 294 lb 14.4 oz  Weight (kg) 134.718 kg 138.166 kg 133.766 kg     Body mass index is 40.28 kg/m.  General:  Moderately obese WM Well nourished, well developed, in no acute distress HEENT: normal Lymph: no adenopathy Neck: no JVD Endocrine:  No thryomegaly Vascular: No carotid bruits; FA pulses 2+ bilaterally without bruits  Cardiac:  normal S1, S2; RRR; no murmur  Lungs:  clear to auscultation bilaterally, no wheezing, rhonchi or rales  Abd: soft, nontender, no hepatomegaly  Ext: no edema Musculoskeletal:  No deformities, BUE and BLE strength normal and equal Skin: warm and dry  Neuro:  CNs 2-12 intact, no focal abnormalities noted Psych:  Normal affect   EKG:  The EKG was personally reviewed and demonstrates:  NSR 60 bpm. Nonspecific ST abnormalities.  Telemetry:  Telemetry was personally  reviewed and demonstrates:  NSR 70-80s  Relevant CV Studies:  2D Echo 09/08/19 Study Conclusions  - Left ventricle: The cavity size was normal. Wall thickness was   increased in a pattern of severe LVH. Systolic function was   normal. The estimated ejection fraction was in the range of 55%   to 60%. Wall motion was normal; there were no regional wall   motion abnormalities. Doppler parameters are consistent with   abnormal left ventricular relaxation (grade 1 diastolic   dysfunction). - Aortic valve: Trileaflet; mildly thickened, mildly calcified   leaflets. - Left atrium: The atrium was mildly dilated. - Right ventricle: The cavity size was mildly dilated. Wall   thickness was normal.  Impressions:  - No cardiac source of emboli was indentified.  Laboratory Data:  Chemistry Recent Labs  Lab 10/19/18 1152  NA 140  K 2.7*  CL 99  CO2 30  GLUCOSE 228*  BUN 14  CREATININE 1.27*  CALCIUM 9.2  GFRNONAA >60  GFRAA >60  ANIONGAP 11    No results for input(s): PROT, ALBUMIN, AST, ALT, ALKPHOS, BILITOT in the last 168 hours. Hematology Recent Labs  Lab 10/19/18 1152  WBC 9.0  RBC 5.09  HGB 14.7  HCT 42.5  MCV 83.5  MCH 28.9  MCHC 34.6  RDW 12.2  PLT 212   Cardiac Enzymes Recent Labs  Lab 10/19/18 1152  TROPONINI <0.03   No results for input(s): TROPIPOC in the last 168 hours.  BNPNo results for input(s): BNP, PROBNP in the last 168 hours.  DDimer No results for input(s): DDIMER in the last 168 hours.  Radiology/Studies:  Dg Chest 2 View  Result Date: 10/19/2018 CLINICAL DATA:  Left upper chest pain for 1 and half days. EXAM: CHEST - 2 VIEW COMPARISON:  October 23, 2007  FINDINGS: The heart size and mediastinal contours are within normal limits. Both lungs are clear. The visualized skeletal structures are unremarkable. IMPRESSION: No active cardiopulmonary disease. Electronically Signed   By: Dorise Bullion III M.D   On: 10/19/2018 12:33    Assessment  and Plan:   Derek Duarte is a 58 y.o. male with a hx of hypertension, diabetes and TIA who was sent to the ED by his PCP for chest pain and abnormal EKG.  He is being seen today for evaluation of chest pain at the request of Derek Duarte internal medicine.   1. Chest pain: Very atypical chest pain in the left upper chest, present for the past 2 to 3 days and fairly constant with no exacerbating factors.  Is not worse with exertion.  Nonpleuritic.  No associated dyspnea.  Chest x-ray unremarkable.  EKG shows sinus rhythm with nonspecific ST abnormalities.  Initial point-of-care troponin in the ED is negative.  Recent echocardiogram done December 2019 for TIA evaluation showed normal left ventricular ejection fraction and normal wall motion.  There were no significant valvular abnormalities.  Cardiac risk factors include male sex over the age of 78, hypertension and diabetes.  His symptoms however are not consistent with ischemic etiology (constant CP x 2-3 days w/ negative troponin). We do not feel he needs additional cardiac w/u at this time. He can be discharged from a cardiac standpoint. He wishes to follow up with [facility] given Dr. Deatra Robinson recent retirement. We will arrange f/u with an APP. If he continues to have CP further down the road, then we can consider outpatient ischemic w/u. We recommend continued control of his cardiac risk factors, management of his hypertension and diabetes.   Weight loss/lifestyle modification would also be very beneficial.  He reports that he is pretty sedentary.  2.  Hypokalemia: 2.7 in the ED. Magnesium within normal limits 1.9.  He needs supplementation before discharge.   He does appear to be on hydrochlorothiazide as an outpatient and dose was recently increased.   He needs to be discharged on supplemental K and can f/u with Derek Duarte for repeat BMP and further management of HTN.   3. HTN: being managed by PCP w/ recent med adjsutments.  Continue current  regimen. F/u w/ PCP.   For questions or updates, please contact [place] Please consult [email or web address] for contact info under     Signed, Lyda Jester, PA-C  10/19/2018 3:06 PM Patient seen and examined and history reviewed. Agree with above findings and plan. 58 yo WM with history of HTN and HLD seen for evaluation of chest pain. Patient reports a mild ache in his left anterior chest for 2 days duration. Pain is constant. Does not change with activity or position. He has never had this before. Seen by primary care. Ecg showed nonspecific TWA and referred for further evaluation. His BP has been difficult to control. Recently increased dose of amlodipine and HCTZ. Does not take potassium. Recent Echo unremarkable following TIA.  On exam he is an overweight WM in NAD BP 156/86. Pulse 58.  Lungs clear.  CV RRR without gallop murmur or click. No chest wall tenderness to palpation Abd negative.   Ecg reviewed shows NSR with nonspecific T wave abnormality. Unchanged from office Ecg.  Troponin is normal. Potassium down to 2.7.  Impresson: 1. Atypical chest pain. Symptoms mild and present for 48 hours. Unlikely to be ACS. Ecg changes are nonspecific and likely related to hypokalemia and HTN. I don't  think he needs further cardiac evaluation at this point. We will arrange outpatient follow up. If he continues to have chest pain we could consider stress testing as an outpatient. Need to replete potassium. OK for DC from our standpoint.    Derek Duarte, [place] 10/19/2018 3:28 PM

## 2018-10-19 NOTE — Discharge Instructions (Signed)

## 2018-10-21 DIAGNOSIS — G459 Transient cerebral ischemic attack, unspecified: Secondary | ICD-10-CM | POA: Diagnosis not present

## 2018-10-22 ENCOUNTER — Ambulatory Visit (INDEPENDENT_AMBULATORY_CARE_PROVIDER_SITE_OTHER): Payer: 59 | Admitting: Neurology

## 2018-10-22 ENCOUNTER — Encounter: Payer: Self-pay | Admitting: Neurology

## 2018-10-22 VITALS — BP 146/88 | HR 67 | Ht 72.0 in | Wt 306.0 lb

## 2018-10-22 DIAGNOSIS — R0683 Snoring: Secondary | ICD-10-CM | POA: Diagnosis not present

## 2018-10-22 DIAGNOSIS — R29818 Other symptoms and signs involving the nervous system: Secondary | ICD-10-CM | POA: Diagnosis not present

## 2018-10-22 DIAGNOSIS — G479 Sleep disorder, unspecified: Secondary | ICD-10-CM

## 2018-10-22 DIAGNOSIS — R079 Chest pain, unspecified: Secondary | ICD-10-CM | POA: Diagnosis not present

## 2018-10-22 DIAGNOSIS — R351 Nocturia: Secondary | ICD-10-CM

## 2018-10-22 DIAGNOSIS — G4700 Insomnia, unspecified: Secondary | ICD-10-CM

## 2018-10-22 DIAGNOSIS — Z6841 Body Mass Index (BMI) 40.0 and over, adult: Secondary | ICD-10-CM

## 2018-10-22 NOTE — Patient Instructions (Signed)

## 2018-10-22 NOTE — Progress Notes (Signed)
Subjective:    Patient ID: Derek Duarte is a 58 y.o. male.  HPI     Star Age, MD, PhD [facility] [address] [place], [place] [address]   Dear Mamie Nick,   I saw your patient, Derek Duarte, upon your kind request in my sleep clinic today for initial consultation of his sleep disorder, in particular, concern for underlying obstructive sleep apnea. The patient is unaccompanied today. As you know, Derek Duarte is a 58 year old right-handed gentleman with an underlying medical history of hypertension, hyperlipidemia, gout, low testosterone, diabetes, depression, BPH, recent concern for TIA and history of morbid obesity with a BMI of over 40, who reports snoring and excessive daytime somnolence. I reviewed your office note from 08/28/2018. His Epworth sleepiness score is 1 out of 24, fatigue score is 26 out of 63. He works from home. He is a nonsmoker and does not utilize alcohol, caffeine in limitation and not daily, about 4 cups of tea on a weekly basis. Wife has not noted any apneas.      He has a long-standing history of difficulty initiating and maintaining sleep. He is currently on trazodone 150 mg each night. He is not aware of any family history of OSA. He had a tonsillectomy at age 89. Bedtime is between 10:30 and 11. He typically reads while in bed. He does not watch TV in his bedroom. Rise time is around 6:30 or 7. 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 They have no pets. He works from home for [employer]. He had chest pain this past weekend and was admitted on 10/19/2018 for observation. He was diagnosed with atypical chest pain and has a cardiology follow-up pending. He has had intermittent vertigo symptoms and complains of short-term memory issues. He denies morning headaches but has nocturia about 3 times for average night.                                                                                                                                               His Past Medical History Is Significant For: Past Medical History:  Diagnosis Date  . BPH (benign prostatic hyperplasia)   . Depression   . Diabetes mellitus without complication ([place])   . Gout   . Hyperlipidemia   . Hypertension   . Insomnia   . Low testosterone     His Past Surgical History Is Significant For: Past Surgical History:  Procedure Laterality Date  . HERNIA REPAIR      His Family History Is Significant For: Family History  Problem Relation Age of Onset  . Hypertension Other   . Atrial fibrillation Mother        had ablation    His Social History Is Significant For: Social History   Socioeconomic History  . Marital status: Married  Spouse name: Not on file  . Number of children: Not on file  . Years of education: Not on file  . Highest education level: Not on file  Occupational History  . Not on file  Social Needs  . Financial resource strain: Not on file  . Food insecurity:    Worry: Not on file    Inability: Not on file  . Transportation needs:    Medical: Not on file    Non-medical: Not on file  Tobacco Use  . Smoking status: Never Smoker  . Smokeless tobacco: Never Used  Substance and Sexual Activity  . Alcohol use: Yes    Alcohol/week: 2.0 standard drinks    Types: 2 Cans of beer per week  . Drug use: Not Currently  . Sexual activity: Not on file  Lifestyle  . Physical activity:    Days per week: Not on file    Minutes per session: Not on file  . Stress: Not on file  Relationships  . Social connections:    Talks on phone: Not on file    Gets together: Not on file    Attends religious service: Not on file    Active member of club or organization: Not on file    Attends meetings of clubs or organizations: Not on file    Relationship status: Not on file  Other Topics Concern  . Not on file  Social History Narrative  . Not on file    His Allergies Are:  No Known  Allergies:   His Current Medications Are:  Outpatient Encounter Medications as of 10/22/2018  Medication Sig  . allopurinol (ZYLOPRIM) 300 MG tablet Take 300 mg by mouth daily.  Marland Kitchen amLODipine (NORVASC) 10 MG tablet Take 10 mg by mouth daily.  Marland Kitchen aspirin 325 MG tablet Take 325 mg by mouth daily.  Marland Kitchen buPROPion (WELLBUTRIN XL) 150 MG 24 hr tablet Take 150 mg by mouth daily.   . carvedilol (COREG) 12.5 MG tablet Take 12.5 mg by mouth 2 (two) times daily.  Marland Kitchen escitalopram (LEXAPRO) 10 MG tablet Take 10 mg by mouth daily.  Marland Kitchen glimepiride (AMARYL) 4 MG tablet Take 4 mg by mouth 2 (two) times daily.  . hydrochlorothiazide (HYDRODIURIL) 12.5 MG tablet Take 12.5 mg by mouth daily.  . metFORMIN (GLUCOPHAGE) 500 MG tablet Take 500 mg by mouth 2 (two) times daily with a meal.  . potassium chloride 20 MEQ TBCR Take 40 mEq by mouth daily.  . rosuvastatin (CRESTOR) 5 MG tablet Take 5 mg by mouth every Monday, Wednesday, and Friday.   . tamsulosin (FLOMAX) 0.4 MG CAPS capsule Take 0.4 mg by mouth daily.  Marland Kitchen telmisartan-hydrochlorothiazide (MICARDIS HCT) 80-12.5 MG tablet Take 1 tablet by mouth daily.  Marland Kitchen testosterone cypionate (DEPOTESTOSTERONE CYPIONATE) 200 MG/ML injection Inject 100 mg into the muscle every 14 (fourteen) days.   . traZODone (DESYREL) 150 MG tablet Take 150 mg by mouth at bedtime.   No facility-administered encounter medications on file as of 10/22/2018.   :  Review of Systems:  Out of a complete 14 point review of systems, all are reviewed and negative with the exception of these symptoms as listed below: Review of Systems  Neurological:       Pt presents today to discuss his sleep. Pt has never had a sleep study but does endorse snoring.  Epworth Sleepiness Scale 0= would never doze 1= slight chance of dozing 2= moderate chance of dozing 3= high chance of dozing  Sitting and  reading: 0 Watching TV: 0 Sitting inactive in a public place (ex. Theater or meeting): 0 As a passenger in a  car for an hour without a break: 0 Lying down to rest in the afternoon: 1 Sitting and talking to someone: 0 Sitting quietly after lunch (no alcohol): 0 In a car, while stopped in traffic: 0 Total: 1     Objective:  Neurological Exam  Physical Exam Physical Examination:   Vitals:   10/22/18 0900  BP: (!) 146/88  Pulse: 67    General Examination: The patient is a very pleasant 58 y.o. male in no acute distress. He appears well-developed and well-nourished and well groomed.   HEENT: Normocephalic, atraumatic, pupils are equal, round and reactive to light and accommodation. Funduscopic exam is normal with sharp disc margins noted. Extraocular tracking is good without limitation to gaze excursion or nystagmus noted. Normal smooth pursuit is noted. Hearing is grossly intact. Tympanic membranes are clear bilaterally. Face is symmetric with normal facial animation and normal facial sensation. Speech is clear with no dysarthria noted. There is no hypophonia. There is no lip, neck/head, jaw or voice tremor. Neck shows FROM. There are no carotid bruits on auscultation. Oropharynx exam reveals: moderate mouth dryness, adequate dental hygiene and moderate airway crowding, due to smaller airway entry larger tongue. Tonsils absent. Mallampati is class II. Neck circumference is 19 inches. He has a moderate overbite.tongue protrudes centrally and palate elevates symmetrically.  Chest: Clear to auscultation without wheezing, rhonchi or crackles noted.  Heart: S1+S2+0, regular and normal without murmurs, rubs or gallops noted.   Abdomen: Soft, non-tender and non-distended.  Extremities: There is no pitting edema in the distal lower extremities bilaterally.   Skin: Warm and dry without trophic changes noted.  Musculoskeletal: exam reveals no obvious joint deformities, tenderness or joint swelling or erythema.   Neurologically:  Mental status: The patient is awake, alert and oriented in all 4  spheres. His immediate and remote memory, attention, language skills and fund of knowledge are appropriate. There is no evidence of aphasia, agnosia, apraxia or anomia. Speech is clear with normal prosody and enunciation. Thought process is linear. Mood is normal and affect is normal.  Cranial nerves II - XII are as described above under HEENT exam. In addition: shoulder shrug is normal with equal shoulder height noted. Motor exam: Normal bulk, strength and tone is noted. There is no tremor. Romberg is negative. Fine motor skills and coordination: grossly intact.  Cerebellar testing: No dysmetria or intention tremor. There is no truncal or gait ataxia.  Sensory exam: intact to light touch.  Gait, station and balance: He stands easily. No veering to one side is noted. No leaning to one side is noted. Posture is age-appropriate and stance is narrow based. Gait shows normal stride length and normal pace. No problems turning are noted. Tandem walk is doable.   Assessment and Plan:  In summary, Derek Duarte is a very pleasant 58 y.o.-year old male with an underlying medical history of hypertension, hyperlipidemia, gout, low testosterone, diabetes, depression, BPH, recent concern for TIA, recent admission to the hospital for chest pain, and history of morbid obesity with a BMI of over 40, whose history and physical exam are concerning for obstructive sleep apnea (OSA). I had a long chat with the patient about my findings and the diagnosis of OSA, its prognosis and treatment options. We talked about medical treatments, surgical interventions and non-pharmacological approaches. I explained in particular the risks and ramifications of untreated moderate  to severe OSA, especially with respect to developing cardiovascular disease down the Road, including congestive heart failure, difficult to treat hypertension, cardiac arrhythmias, or stroke. Even type 2 diabetes has, in part, been linked to untreated OSA.  Symptoms of untreated OSA include daytime sleepiness, memory problems, mood irritability and mood disorder such as depression and anxiety, lack of energy, as well as recurrent headaches, especially morning headaches. We talked about trying to maintain a healthy lifestyle in general, as well as the importance of weight control. I encouraged the patient to eat healthy, exercise daily and keep well hydrated, to keep a scheduled bedtime and wake time routine, to not skip any meals and eat healthy snacks in between meals. I advised the patient not to drive when feeling sleepy. I recommended the following at this time: sleep study with potential positive airway pressure titration. (We will score hypopneas at 4%).   I explained the sleep test procedure to the patient and also outlined possible surgical and non-surgical treatment options of OSA, including the use of a custom-made dental device (which would require a referral to a specialist dentist or oral surgeon), upper airway surgical options, such as pillar implants, radiofrequency surgery, tongue base surgery, and UPPP (which would involve a referral to an ENT surgeon). Rarely, jaw surgery such as mandibular advancement may be considered.  I also explained the CPAP treatment option to the patient, who indicated that he would be willing to try CPAP if the need arises. I explained the importance of being compliant with PAP treatment, not only for insurance purposes but primarily to improve His symptoms, and for the patient's long term health benefit, including to reduce His cardiovascular risks. I answered all his questions today and the patient was in agreement. I plan to see him back after the sleep study is completed and encouraged him to call with any interim questions, concerns, problems or updates.   Thank you very much for allowing me to participate in the care of this nice patient. If I can be of any further assistance to you please do not hesitate to call  me at [phone].  Sincerely,   Star Age, MD, PhD

## 2018-10-28 NOTE — Progress Notes (Signed)
Cardiology Office Note   Date:  10/29/2018   ID:  Derek Duarte, DOB Mar 04, 1961, MRN [id number]  PCP:  Lawerance Cruel, MD  Cardiologist:  Dr. Donnetta Hutching [facility] retired), seen by Dr. Martinique on consultation.  Chief Complaint  Patient presents with  . Hypertension  . Chest Pain     History of Present Illness: Derek Duarte is a 58 y.o. male who presents for post hospitalization follow up after admission for chest pain after being seen by PCP for abnormal EKG and chest discomfort. He has a history of HTN, HL, Diabetes, and TIA.   On evaluation by Dr. Martinique he was ruled out for ACS with negative troponin, and EKG, but was found to have hypokalemia at 2.7.  Chest pain was found to be atypical. He was not felt he needed further cardiac at this time. He was to control CVRF and increase his exercise.   He his still going back and forth between Dr. Stephan Minister office and our office for testing and follow up. He is scheduled for a sleep study. He is planning on only seeing [facility] providers once he finishes his testing which was pre-planned prior to ED visit.   Past Medical History:  Diagnosis Date  . BPH (benign prostatic hyperplasia)   . Depression   . Diabetes mellitus without complication ([place])   . Gout   . Hyperlipidemia   . Hypertension   . Insomnia   . Low testosterone     Past Surgical History:  Procedure Laterality Date  . HERNIA REPAIR       Current Outpatient Medications  Medication Sig Dispense Refill  . allopurinol (ZYLOPRIM) 300 MG tablet Take 300 mg by mouth daily.    Marland Kitchen amLODipine (NORVASC) 10 MG tablet Take 10 mg by mouth daily.    Marland Kitchen aspirin 325 MG tablet Take 325 mg by mouth daily.    Marland Kitchen buPROPion (WELLBUTRIN XL) 150 MG 24 hr tablet Take 150 mg by mouth daily.   2  . carvedilol (COREG) 12.5 MG tablet Take 12.5 mg by mouth 2 (two) times daily.  4  . escitalopram (LEXAPRO) 10 MG tablet Take 10 mg by mouth daily.    Marland Kitchen glimepiride (AMARYL) 4 MG tablet Take 4  mg by mouth 2 (two) times daily.    . hydrochlorothiazide (HYDRODIURIL) 12.5 MG tablet Take 12.5 mg by mouth daily.    . metFORMIN (GLUCOPHAGE) 500 MG tablet Take 500 mg by mouth 2 (two) times daily with a meal.    . potassium chloride 20 MEQ TBCR Take 40 mEq by mouth daily. 30 tablet 0  . rosuvastatin (CRESTOR) 5 MG tablet Take 5 mg by mouth every Monday, Wednesday, and Friday.     . tamsulosin (FLOMAX) 0.4 MG CAPS capsule Take 0.4 mg by mouth daily.  4  . telmisartan-hydrochlorothiazide (MICARDIS HCT) 80-12.5 MG tablet Take 1 tablet by mouth daily.    Marland Kitchen testosterone cypionate (DEPOTESTOSTERONE CYPIONATE) 200 MG/ML injection Inject 100 mg into the muscle every 14 (fourteen) days.   1  . traZODone (DESYREL) 150 MG tablet Take 150 mg by mouth at bedtime.     No current facility-administered medications for this visit.     Allergies:   Patient has no known allergies.    Social History:  The patient  reports that he has never smoked. He has never used smokeless tobacco. He reports current alcohol use of about 2.0 standard drinks of alcohol per week. He reports previous drug use.  Family History:  The patient's family history includes Atrial fibrillation in his mother; Hypertension in an other family member.    ROS: All other systems are reviewed and negative. Unless otherwise mentioned in H&P    PHYSICAL EXAM: VS:  BP (!) 176/98   Pulse 82   Ht 6' (1.829 m)   Wt (!) 303 lb 9.6 oz (137.7 kg)   BMI 41.18 kg/m  , BMI Body mass index is 41.18 kg/m. GEN: Well nourished, well developed, in no acute distress, obese  HEENT: normal Neck: no JVD, carotid bruits, or masses Cardiac: RRR; no murmurs, rubs, or gallops,no edema  Respiratory:  Clear to auscultation bilaterally, normal work of breathing GI: soft, nontender, nondistended, + BS MS: no deformity or atrophy Skin: warm and dry, no rash Neuro:  Strength and sensation are intact Psych: euthymic mood, full affect   EKG:  Not  completed this office visit.   Recent Labs: 10/19/2018: BUN 14; Creatinine, Ser 1.27; Hemoglobin 14.7; Magnesium 1.9; Platelets 212; Potassium 2.7; Sodium 140    Lipid Panel No results found for: CHOL, TRIG, HDL, CHOLHDL, VLDL, LDLCALC, LDLDIRECT    Wt Readings from Last 3 Encounters:  10/29/18 (!) 303 lb 9.6 oz (137.7 kg)  10/22/18 (!) 306 lb (138.8 kg)  10/19/18 297 lb (134.7 kg)      Other studies Reviewed: 2D Echo 09/08/19 Study Conclusions  - Left ventricle: The cavity size was normal. Wall thickness was increased in a pattern of severe LVH. Systolic function was normal. The estimated ejection fraction was in the range of 55% to 60%. Wall motion was normal; there were no regional wall motion abnormalities. Doppler parameters are consistent with abnormal left ventricular relaxation (grade 1 diastolic dysfunction). - Aortic valve: Trileaflet; mildly thickened, mildly calcified leaflets. - Left atrium: The atrium was mildly dilated. - Right ventricle: The cavity size was mildly dilated. Wall thickness was normal.  Impressions:  - No cardiac source of emboli was indentified.   ASSESSMENT AND PLAN:  1. Hypertension: This has been difficult to control telmisartan/HCTZ, coreg BID and amlodipine. I will change his amlodipine to pm dosing as his BP is elevated in the am, continue the telmisartan/HCTZ in the am. Continue coreg BID dosing. He is encouraged to keep appointment for sleep study to evaluate for OSA. I did recheck his BP in the exam room, 168/82  We will plan a 2-day stress myoview (due to central obesity) to evaluate for ischemia due to difficult BP control, with CVRF of Type II diabetes, HL, HTN and obesity   2. TIA: Zio monitor was placed by [facility]. We have reviewed the results. He had a couple of episodes when he was asleep in the early morning hours where is HR dropped in to the 40's and one episode where his HR was around 100 in the late  evening. He had some PAC's and PVC's but no pauses or atrial fib.    Current medicines are reviewed at length with the patient today.    Labs/ tests ordered today include: 2-Day Stress Myoview Phill Myron. [place], ANP, [place]    10/29/2018 1:17 PM    [place] [address] Office [phone] Fax [phone]

## 2018-10-29 ENCOUNTER — Ambulatory Visit (INDEPENDENT_AMBULATORY_CARE_PROVIDER_SITE_OTHER): Payer: 59 | Admitting: Adult Health

## 2018-10-29 ENCOUNTER — Encounter: Payer: Self-pay | Admitting: Adult Health

## 2018-10-29 VITALS — BP 176/98 | HR 82 | Ht 72.0 in | Wt 303.6 lb

## 2018-10-29 DIAGNOSIS — G459 Transient cerebral ischemic attack, unspecified: Secondary | ICD-10-CM | POA: Diagnosis not present

## 2018-10-29 DIAGNOSIS — I10 Essential (primary) hypertension: Secondary | ICD-10-CM

## 2018-10-29 DIAGNOSIS — R079 Chest pain, unspecified: Secondary | ICD-10-CM | POA: Diagnosis not present

## 2018-10-29 NOTE — Patient Instructions (Addendum)
Follow-Up: You will need a follow up appointment in 1 months.  You may see Peter Martinique, MD Jory Sims, DNP, AACC  or one of the following Advanced Practice Providers on your designated Care Team:  Almyra Deforest, PA-C  Fabian Sharp, [place]       Medication Instructions:  Take your amlodipine in the PM  Your physician recommends that you continue on your current medications as directed. Please refer to the Current Medication list given to you today. If you need a refill on your cardiac medications before your next appointment, please call your pharmacy. Labwork: When you have labs (blood work) and your tests are completely normal, you will receive your results ONLY by [facility] (if you have MyChart) -OR- A paper copy in the mail.  At [facility], you and your health needs are our priority.  As part of our continuing mission to provide you with exceptional heart care, we have created designated Provider Care Teams.  These Care Teams include your primary Cardiologist (physician) and Advanced Practice Providers (APPs -  Physician Assistants and Nurse Practitioners) who all work together to provide you with the care you need, when you need it.  Thank you for choosing [facility] at [facility]!!      ----> ADDED AFTER 2-DAY MYOVIEW-HOLD COREG THE MORNING OF THE PROCEDURE.

## 2018-10-31 ENCOUNTER — Other Ambulatory Visit: Payer: Self-pay | Admitting: Adult Health

## 2018-10-31 DIAGNOSIS — G4733 Obstructive sleep apnea (adult) (pediatric): Secondary | ICD-10-CM | POA: Diagnosis not present

## 2018-10-31 DIAGNOSIS — I517 Cardiomegaly: Secondary | ICD-10-CM | POA: Diagnosis not present

## 2018-10-31 DIAGNOSIS — I119 Hypertensive heart disease without heart failure: Secondary | ICD-10-CM | POA: Diagnosis not present

## 2018-11-12 ENCOUNTER — Telehealth (HOSPITAL_COMMUNITY): Payer: Self-pay | Admitting: *Deleted

## 2018-11-12 ENCOUNTER — Ambulatory Visit (INDEPENDENT_AMBULATORY_CARE_PROVIDER_SITE_OTHER): Payer: 59 | Admitting: Neurology

## 2018-11-12 DIAGNOSIS — G479 Sleep disorder, unspecified: Secondary | ICD-10-CM

## 2018-11-12 DIAGNOSIS — G4733 Obstructive sleep apnea (adult) (pediatric): Secondary | ICD-10-CM

## 2018-11-12 DIAGNOSIS — Z6841 Body Mass Index (BMI) 40.0 and over, adult: Secondary | ICD-10-CM

## 2018-11-12 DIAGNOSIS — G4700 Insomnia, unspecified: Secondary | ICD-10-CM

## 2018-11-12 DIAGNOSIS — R351 Nocturia: Secondary | ICD-10-CM

## 2018-11-12 DIAGNOSIS — R0683 Snoring: Secondary | ICD-10-CM

## 2018-11-12 DIAGNOSIS — R079 Chest pain, unspecified: Secondary | ICD-10-CM

## 2018-11-12 DIAGNOSIS — R29818 Other symptoms and signs involving the nervous system: Secondary | ICD-10-CM

## 2018-11-12 NOTE — Telephone Encounter (Signed)
Patient given detailed instructions per Myocardial Perfusion Study Information Sheet for the test on 11/14/18. Patient notified to arrive 15 minutes early and that it is imperative to arrive on time for appointment to keep from having the test rescheduled.  If you need to cancel or reschedule your appointment, please call the office within 24 hours of your appointment. . Patient verbalized understanding. Kirstie Peri

## 2018-11-14 ENCOUNTER — Encounter: Payer: 59 | Admitting: *Deleted

## 2018-11-14 ENCOUNTER — Ambulatory Visit (HOSPITAL_COMMUNITY): Payer: 59 | Attending: Internal Medicine

## 2018-11-14 DIAGNOSIS — R079 Chest pain, unspecified: Secondary | ICD-10-CM | POA: Diagnosis present

## 2018-11-14 DIAGNOSIS — Z006 Encounter for examination for normal comparison and control in clinical research program: Secondary | ICD-10-CM

## 2018-11-14 MED ORDER — TECHNETIUM TC 99M TETROFOSMIN IV KIT
31.2000 | PACK | Freq: Once | INTRAVENOUS | Status: AC | PRN
  Administered 2018-11-14: 31.2 via INTRAVENOUS
  Filled 2018-11-14: qty 32

## 2018-11-14 NOTE — Research (Signed)
CADFEM Informed Consent                  Subject Name:   Derek Duarte   Subject met inclusion and exclusion criteria.  The informed consent form, study requirements and expectations were reviewed with the subject and questions and concerns were addressed prior to the signing of the consent form.  The subject verbalized understanding of the trial requirements.  The subject agreed to participate in the CADFEM trial and signed the informed consent.  The informed consent was obtained prior to performance of any protocol-specific procedures for the subject.  A copy of the signed informed consent was given to the subject and a copy was placed in the subject's medical record.   [place] Sharolyn Weber, Research Assistant 11/14/2018   12:36

## 2018-11-15 ENCOUNTER — Ambulatory Visit (HOSPITAL_COMMUNITY): Payer: 59 | Attending: Cardiovascular Disease

## 2018-11-15 ENCOUNTER — Telehealth: Payer: Self-pay

## 2018-11-15 LAB — MYOCARDIAL PERFUSION IMAGING
CHL CUP NUCLEAR SDS: 1
CHL CUP NUCLEAR SSS: 1
Estimated workload: 7.2 METS
Exercise duration (min): 6 min
Exercise duration (sec): 30 s
LV dias vol: 147 mL (ref 62–150)
LV sys vol: 83 mL
MPHR: 163 {beats}/min
Peak HR: 146 {beats}/min
Percent HR: 89 %
Rest HR: 86 {beats}/min
SRS: 0
TID: 0.98

## 2018-11-15 MED ORDER — TECHNETIUM TC 99M TETROFOSMIN IV KIT
30.4000 | PACK | Freq: Once | INTRAVENOUS | Status: AC | PRN
  Administered 2018-11-15: 30.4 via INTRAVENOUS
  Filled 2018-11-15: qty 31

## 2018-11-15 NOTE — Progress Notes (Signed)
Patient referred by Dr. Leonie Man, seen by me on 10/22/18, HST on 11/12/18.    Please call and notify the patient that the recent home sleep test showed obstructive sleep apnea in the moderate range. While I recommend treatment for this in the form CPAP, his insurance will not approve a sleep study for this. They will likely only approve a trial of autoPAP, which means, that we don't have to bring him in for a sleep study with CPAP, but will let him try an autoPAP machine at home, through a DME company (of his choice, or as per insurance requirement). The DME representative will educate him on how to use the machine, how to put the mask on, etc. I have placed an order in the chart. Please send referral, talk to patient, send report to referring MD. We will need a FU in sleep clinic for 10 weeks post-PAP set up, please arrange that with me or one of our NPs. Pls reinforce the importance of compliance with treatment. Thanks,   Star Age, MD, PhD [facility])

## 2018-11-15 NOTE — Procedures (Signed)
[facility] @[facility] [place], [place] [address] NAME:  Derek Duarte                                                                    DOB: 1961-07-20 MEDICAL RECORD no: [id number]                                                        DOS:  11/12/2018 REFERRING PHYSICIAN: Antony Contras, MD STUDY PERFORMED: Home Sleep Test on Watch Pat HISTORY: 58 year old man with a history of hypertension, hyperlipidemia, gout, low testosterone, diabetes, depression, BPH, recent concern for TIA and history of morbid obesity, who reports snoring and excessive daytime somnolence, BMI of 41.1.        STUDY RESULTS:   Total Recording Time: 7 hrs, 24 mins; Total Sleep Time: 5 hrs, 48 mins Total Apnea/Hypopnea Index (AHI): 24.7 /h; RDI: 26.3/h; REM AHI: 45.5/h Average Oxygen Saturation:  93%; Lowest Oxygen Desaturation: 85%  Total Time Oxygen Saturation Below or at 88%:   3.3 minutes  Average Heart Rate:   69 bpm (between  51 and 127 bpm) IMPRESSION: OSA RECOMMENDATION: This home sleep test demonstrates moderate obstructive sleep apnea with a total AHI of 24.7/hour and O2 nadir of 85%. Given the patient's medical history and sleep related complaints, treatment with positive airway pressure (in the form of CPAP) is recommended. This will require a full night CPAP titration study for proper treatment settings, O2 monitoring and mask fitting. Based on the severity of the sleep disordered breathing an attended titration study is indicated. However, patient's insurance has denied an attended sleep study; therefore, the patient will be advised to proceed with an autoPAP titration/trial at home for now. Please note that untreated obstructive sleep apnea may carry additional perioperative morbidity. Patients with significant obstructive sleep apnea should receive perioperative PAP therapy and the surgeons and particularly the anesthesiologist should be informed of the diagnosis and the  severity of the sleep disordered breathing. The patient should be cautioned not to drive, work at heights, or operate dangerous or heavy equipment when tired or sleepy. Review and reiteration of good sleep hygiene measures should be pursued with any patient. Other causes of the patient's symptoms, including circadian rhythm disturbances, an underlying mood disorder, medication effect and/or an underlying medical problem cannot be ruled out based on this test. Clinical correlation is recommended. The patient and his referring provider will be notified of the test results. The patient will be seen in follow up in sleep clinic at [facility]. I certify that I have reviewed the raw data recording prior to the issuance of this report in accordance with the standards of the American Academy of Sleep Medicine (AASM).   Star Age, MD, PhD [facility]) Diplomat, ABPN (Neurology and Sleep)

## 2018-11-15 NOTE — Telephone Encounter (Signed)
I called pt. I advised pt that Dr. Rexene Alberts reviewed their sleep study results and found that pt has moderate osa. Dr. Rexene Alberts recommends that pt start an auto pap at home since pt's insurance will likely deny an in lab titration study. I reviewed PAP compliance expectations with the pt. Pt is agreeable to starting an auto-PAP. I advised pt that an order will be sent to a DME, [facility], and [facility] will call the pt within about one week after they file with the pt's insurance. [facility] will show the pt how to use the machine, fit for masks, and troubleshoot the auto-PAP if needed. A follow up appt was made for insurance purposes with Dr. Rexene Alberts on 02/25/2019 at 10:30am. (Pt did not want to see an NP and only can come on Mondays or Fridays.) Pt verbalized understanding to arrive 15 minutes early and bring their auto-PAP. A letter with all of this information in it will be mailed to the pt as a reminder. I verified with the pt that the address we have on file is correct. Pt verbalized understanding of results. Pt had no questions at this time but was encouraged to call back if questions arise. I have sent the order to [facility] and have received confirmation that they have received the order.

## 2018-11-15 NOTE — Addendum Note (Signed)
Addended by: Star Age on: 11/15/2018 08:35 AM   Modules accepted: Orders

## 2018-11-15 NOTE — Telephone Encounter (Signed)
-----   Message from Star Age, MD sent at 11/15/2018  8:35 AM EST ----- Patient referred by Dr. Leonie Man, seen by me on 10/22/18, HST on 11/12/18.    Please call and notify the patient that the recent home sleep test showed obstructive sleep apnea in the moderate range. While I recommend treatment for this in the form CPAP, his insurance will not approve a sleep study for this. They will likely only approve a trial of autoPAP, which means, that we don't have to bring him in for a sleep study with CPAP, but will let him try an autoPAP machine at home, through a DME company (of his choice, or as per insurance requirement). The DME representative will educate him on how to use the machine, how to put the mask on, etc. I have placed an order in the chart. Please send referral, talk to patient, send report to referring MD. We will need a FU in sleep clinic for 10 weeks post-PAP set up, please arrange that with me or one of our NPs. Pls reinforce the importance of compliance with treatment. Thanks,   Star Age, MD, PhD [facility])

## 2018-11-21 DIAGNOSIS — H903 Sensorineural hearing loss, bilateral: Secondary | ICD-10-CM | POA: Diagnosis not present

## 2018-11-21 DIAGNOSIS — G4733 Obstructive sleep apnea (adult) (pediatric): Secondary | ICD-10-CM | POA: Diagnosis not present

## 2018-11-21 DIAGNOSIS — R42 Dizziness and giddiness: Secondary | ICD-10-CM | POA: Diagnosis not present

## 2018-11-28 ENCOUNTER — Ambulatory Visit: Payer: 59 | Admitting: Adult Health

## 2018-11-29 DIAGNOSIS — G4733 Obstructive sleep apnea (adult) (pediatric): Secondary | ICD-10-CM | POA: Diagnosis not present

## 2018-11-29 DIAGNOSIS — G4730 Sleep apnea, unspecified: Secondary | ICD-10-CM | POA: Diagnosis not present

## 2018-12-04 DIAGNOSIS — L719 Rosacea, unspecified: Secondary | ICD-10-CM | POA: Diagnosis not present

## 2018-12-04 DIAGNOSIS — E1140 Type 2 diabetes mellitus with diabetic neuropathy, unspecified: Secondary | ICD-10-CM | POA: Diagnosis not present

## 2018-12-08 NOTE — Progress Notes (Deleted)
Cardiology Office Note   Date:  12/08/2018   ID:  Derek Duarte, DOB [birth year], MRN [id number]  PCP:  Lawerance Cruel, MD  Cardiologist:  Dr. Martinique  No chief complaint on file.    History of Present Illness: Derek Duarte is a 58 y.o. male who presents for follow up  chest pain. In January he was seen by PCP for abnormal EKG and chest discomfort. He has a history of HTN, HL, Diabetes, and TIA.   He ruled out for ACS with negative troponin, and EKG, but was found to have hypokalemia at 2.7.  Echo was unremarkable. Myoview showed no ischemia/infarct.   He his still going back and forth between Dr. Stephan Minister office and our office for testing and follow up. He is scheduled for a sleep study. He is planning on only seeing [place] providers once he finishes his testing which was pre-planned prior to ED visit.   Past Medical History:  Diagnosis Date  . BPH (benign prostatic hyperplasia)   . Depression   . Diabetes mellitus without complication ([place])   . Gout   . Hyperlipidemia   . Hypertension   . Insomnia   . Low testosterone     Past Surgical History:  Procedure Laterality Date  . HERNIA REPAIR       Current Outpatient Medications  Medication Sig Dispense Refill  . allopurinol (ZYLOPRIM) 300 MG tablet Take 300 mg by mouth daily.    Marland Kitchen amLODipine (NORVASC) 10 MG tablet Take 10 mg by mouth daily.    Marland Kitchen aspirin 325 MG tablet Take 325 mg by mouth daily.    Marland Kitchen buPROPion (WELLBUTRIN XL) 150 MG 24 hr tablet Take 150 mg by mouth daily.   2  . carvedilol (COREG) 12.5 MG tablet Take 12.5 mg by mouth 2 (two) times daily.  4  . escitalopram (LEXAPRO) 10 MG tablet Take 10 mg by mouth daily.    Marland Kitchen glimepiride (AMARYL) 4 MG tablet Take 4 mg by mouth 2 (two) times daily.    . hydrochlorothiazide (HYDRODIURIL) 12.5 MG tablet Take 12.5 mg by mouth daily.    . metFORMIN (GLUCOPHAGE) 500 MG tablet Take 500 mg by mouth 2 (two) times daily with a meal.    . potassium chloride 20 MEQ  TBCR Take 40 mEq by mouth daily. 30 tablet 0  . rosuvastatin (CRESTOR) 5 MG tablet Take 5 mg by mouth every Monday, Wednesday, and Friday.     . tamsulosin (FLOMAX) 0.4 MG CAPS capsule Take 0.4 mg by mouth daily.  4  . telmisartan-hydrochlorothiazide (MICARDIS HCT) 80-12.5 MG tablet Take 1 tablet by mouth daily.    Marland Kitchen testosterone cypionate (DEPOTESTOSTERONE CYPIONATE) 200 MG/ML injection Inject 100 mg into the muscle every 14 (fourteen) days.   1  . traZODone (DESYREL) 150 MG tablet Take 150 mg by mouth at bedtime.     No current facility-administered medications for this visit.     Allergies:   Patient has no known allergies.    Social History:  The patient  reports that he has never smoked. He has never used smokeless tobacco. He reports current alcohol use of about 2.0 standard drinks of alcohol per week. He reports previous drug use.   Family History:  The patient's family history includes Atrial fibrillation in his mother; Hypertension in an other family member.    ROS: All other systems are reviewed and negative. Unless otherwise mentioned in H&P    PHYSICAL EXAM: VS:  There were no  vitals taken for this visit. , BMI There is no height or weight on file to calculate BMI. GEN: Well nourished, well developed, in no acute distress, obese  HEENT: normal Neck: no JVD, carotid bruits, or masses Cardiac: RRR; no murmurs, rubs, or gallops,no edema  Respiratory:  Clear to auscultation bilaterally, normal work of breathing GI: soft, nontender, nondistended, + BS MS: no deformity or atrophy Skin: warm and dry, no rash Neuro:  Strength and sensation are intact Psych: euthymic mood, full affect   EKG:  Not completed this office visit.   Recent Labs: 10/19/2018: BUN 14; Creatinine, Ser 1.27; Hemoglobin 14.7; Magnesium 1.9; Platelets 212; Potassium 2.7; Sodium 140    Lipid Panel No results found for: CHOL, TRIG, HDL, CHOLHDL, VLDL, LDLCALC, LDLDIRECT    Wt Readings from Last 3  Encounters:  11/14/18 (!) 303 lb (137.4 kg)  10/29/18 (!) 303 lb 9.6 oz (137.7 kg)  10/22/18 (!) 306 lb (138.8 kg)      Other studies Reviewed: 2D Echo 09/08/19 Study Conclusions  - Left ventricle: The cavity size was normal. Wall thickness was increased in a pattern of severe LVH. Systolic function was normal. The estimated ejection fraction was in the range of 55% to 60%. Wall motion was normal; there were no regional wall motion abnormalities. Doppler parameters are consistent with abnormal left ventricular relaxation (grade 1 diastolic dysfunction). - Aortic valve: Trileaflet; mildly thickened, mildly calcified leaflets. - Left atrium: The atrium was mildly dilated. - Right ventricle: The cavity size was mildly dilated. Wall thickness was normal.  Impressions:  - No cardiac source of emboli was indentified.  Myoview 11/14/18: Study Highlights     Blood pressure demonstrated a hypertensive response to exercise.  Nuclear stress EF: 44%.  There was no ST segment deviation noted during stress.  The left ventricular ejection fraction is moderately decreased (30-44%).  This is an intermediate risk study.   Normal resting and stress perfusion. No ischemia or infarction EF EF estimated at 44% with LVE and diffuse hypokinesis suggest TTE/MRI correlation     ASSESSMENT AND PLAN:  1. Hypertension: This has been difficult to control telmisartan/HCTZ, coreg BID and amlodipine. I will change his amlodipine to pm dosing as his BP is elevated in the am, continue the telmisartan/HCTZ in the am. Continue coreg BID dosing. He is encouraged to keep appointment for sleep study to evaluate for OSA. I did recheck his BP in the exam room, 168/82  We will plan a 2-day stress myoview (due to central obesity) to evaluate for ischemia due to difficult BP control, with CVRF of Type II diabetes, HL, HTN and obesity   2. TIA: Zio monitor was placed by [facility]. We have  reviewed the results. He had a couple of episodes when he was asleep in the early morning hours where is HR dropped in to the 40's and one episode where his HR was around 100 in the late evening. He had some PAC's and PVC's but no pauses or atrial fib.    Rudolph Dobler Martinique MD, [facility]

## 2018-12-12 ENCOUNTER — Telehealth: Payer: Self-pay

## 2018-12-12 NOTE — Telephone Encounter (Signed)
COVID-19 Pre-Screening Questions:  . Have you been in contact with someone that was recently sick with fever/cough or confirmed to have the [place] virus?  NO  *Contact with a confirmed case should stay at home, away from confirmed patient, monitor symptoms, and reach out to PCP for e-visit/additional testing.  2. Do you have any of the following symptoms [cough, fever (100.4 or greater)], and/or shortness of breath)?  NO  *ALL PTS W/ FEVER SHOULD BE REFERRED TO PCP FOR E-VISIT* ________________________________________________________________________________  Cardiac Questionnaire:    Since your last visit or hospitalization:    1. Have you been having chest pain? NO   2. Have you been having shortness of breath? NO   3. Have you been having increasing edema, wt gain, or increase in abdominal girth (pants fitting more tightly)? NO   4. Have you had any passing out spells? NO    *A YES to any of these questions would result in the appointment being kept.  *If all the answers to these questions are NO, we should indicate that given the current situation regarding the worldwide coronarvirus pandemic, at the recommendation of the CDC, we are looking to limit gatherings in our waiting area, and thus will reschedule their appointment beyond four weeks from today.      Patient will be contacted at a later time to reschedule. However, this will depend on ongoing evaluation of the Covid-19 situation.  Please call us if any new questions or concerns arise. Despite this difficult time, we are right here with you.  Routing to C19 CANCEL pool.

## 2018-12-13 ENCOUNTER — Ambulatory Visit: Payer: 59 | Admitting: Cardiology

## 2018-12-17 ENCOUNTER — Ambulatory Visit: Payer: 59 | Admitting: Neurology

## 2018-12-17 ENCOUNTER — Telehealth: Payer: Self-pay | Admitting: Neurology

## 2018-12-17 NOTE — Telephone Encounter (Signed)
I called the patient to inform him that because of the coronavirus healthcare situation our office was limiting in-house appointments.  Patient expressed understanding.  He was stable from neurovascular standpoint without any recurrent symptoms.  He mentioned that he was having mild cognitive impairment and short-term memory difficulties ever since his TIA as well as slight balance issues.  He felt detailed in house evaluation for this could wait for a few months till the health care situation improved.  I recommend he do mentally challenging activities and start taking fish oil thousand international units daily.  I advised him to avoid getting up quickly and walking slowly.  He reviewed call me in case his symptoms got worse.  I offered him a telephonic consultation visit as well as telehealth video visit but he declined and prefers in-house visit when the office policy changes

## 2018-12-30 DIAGNOSIS — G4733 Obstructive sleep apnea (adult) (pediatric): Secondary | ICD-10-CM | POA: Diagnosis not present

## 2018-12-30 DIAGNOSIS — G4730 Sleep apnea, unspecified: Secondary | ICD-10-CM | POA: Diagnosis not present

## 2019-01-29 DIAGNOSIS — G4730 Sleep apnea, unspecified: Secondary | ICD-10-CM | POA: Diagnosis not present

## 2019-01-29 DIAGNOSIS — G4733 Obstructive sleep apnea (adult) (pediatric): Secondary | ICD-10-CM | POA: Diagnosis not present

## 2019-02-08 ENCOUNTER — Telehealth: Payer: Self-pay | Admitting: *Deleted

## 2019-02-08 NOTE — Telephone Encounter (Signed)
Spoke with patient and he it is actually better doing some better today. Patient saw ENT a couple of months ago.ENT did not think vertigo and referred to Dr Leonie Man. Advised patient to follow up with Dr Leonie Man and would forward to Dr Martinique. If any further recommendations office would be in touch.  Patient verbalized understanding.    Back when I saw Dr Leonie Man, it was noted that I had some minor balance issues. Was supposed to have a follow up that got moved because of Covid19   The last 3 days I have had more balance problems getting worse each day.   If I put my head down I fall forward. Similarly if my feet are not completely flat I will go off balance   I have been doing a bunch of yard work, and in one place it is sloped and I had a hard time walking straight. I did it again to verify it.     Do I need to see Dr Leonie Man? Because a tele consultation won't work.     Derek Duarte   [phone] cell

## 2019-02-09 NOTE — Telephone Encounter (Signed)
Agree this sounds like more of a neurologic issue and he should follow up with Dr Leonie Man.  Chriss Mannan Martinique MD, [facility]

## 2019-02-19 ENCOUNTER — Telehealth: Payer: Self-pay | Admitting: Neurology

## 2019-02-19 NOTE — Telephone Encounter (Signed)
Due to current COVID 19 pandemic, our office is severely reducing in office visits until further notice, in order to minimize the risk to our patients and healthcare providers.   Called patient and confirmed a virtual visit for his 6/1 appointment. Patient verbalized understanding of the doxy.me process and I have sent him an e-mail with link and directions as well as my name and office number/hours for reference. Patient understands that he will receive a call from RN to update chart.  Pt understands that although there may be some limitations with this type of visit, we will take all precautions to reduce any security or privacy concerns.  Pt understands that this will be treated like an in office visit and we will file with pt's insurance, and there may be a patient responsible charge related to this service.

## 2019-02-20 ENCOUNTER — Encounter: Payer: Self-pay | Admitting: Neurology

## 2019-02-21 NOTE — Addendum Note (Signed)
Addended by: Lester Amity A on: 02/21/2019 02:10 PM   Modules accepted: Orders

## 2019-02-21 NOTE — Telephone Encounter (Signed)
I called pt. Pt's meds, allergies, and PMH were updated.  Pt reports that his cpap is going well.  Pt understands the doxy.me process and does not have any questions.

## 2019-02-25 ENCOUNTER — Encounter: Payer: Self-pay | Admitting: Neurology

## 2019-02-25 ENCOUNTER — Ambulatory Visit (INDEPENDENT_AMBULATORY_CARE_PROVIDER_SITE_OTHER): Payer: 59 | Admitting: Neurology

## 2019-02-25 ENCOUNTER — Other Ambulatory Visit: Payer: Self-pay

## 2019-02-25 DIAGNOSIS — G4733 Obstructive sleep apnea (adult) (pediatric): Secondary | ICD-10-CM | POA: Diagnosis not present

## 2019-02-25 DIAGNOSIS — Z9989 Dependence on other enabling machines and devices: Secondary | ICD-10-CM | POA: Diagnosis not present

## 2019-02-25 NOTE — Patient Instructions (Signed)
Given verbally, during today's virtual video-based encounter, with verbal feedback received.   

## 2019-02-25 NOTE — Progress Notes (Signed)
Interim history:  [name] is a 58 year old right-handed gentleman with an underlying medical history of hypertension, hyperlipidemia, gout, low testosterone, diabetes, depression, BPH, recent concern for TIA and history of morbid obesity with a BMI of over 40, who presents for a virtual, video based appointment via [email or web address] for follow-up consultation of his obstructive sleep apnea, after recent home sleep testing and starting AutoPap therapy.  The patient is unaccompanied today and joins from home, I am located in my office.  I first met him on 10/22/2018 at the request of Dr. Leonie Man, at which time the patient reported snoring, and difficulty initiating and maintaining sleep.  He had a home sleep test on 11/12/2018 which indicated moderate obstructive sleep apnea with an AHI of 24.7/h, O2 nadir of 85%.  He was advised to proceed with AutoPap therapy at home.   Today, 02/25/2019: Please also see below for virtual visit documentation.  I reviewed his AutoPap compliance data from 01/22/2019 through 02/20/2019 which is a total of 30 days, during which time he used his AutoPap every night with percent used days greater than 4 hours at 60%, indicating suboptimal compliance with an average usage for days on treatment of 4 hours and 29 minutes, residual AHI at goal at 0.3/h, 95th percentile of pressure at 13.3 cm, leak on the high side with a 95th percentile at 26.3 L/min.  Set up date was 11/29/2018.  His compliance from 12/16/2018 through 01/14/2019 was better, percent used days greater than 4 hours at 77% at the time, average usage a little better at 4 hours and 35 minutes.  Previously:   10/22/2018: (He) reports snoring and excessive daytime somnolence. I reviewed your office note from 08/28/2018. His Epworth sleepiness score is 1 out of 24, fatigue score is 26 out of 63. He works from home. He is a nonsmoker and does not utilize alcohol, caffeine in limitation and not daily, about 4 cups of tea on a weekly  basis. Wife has not noted any apneas.      He has a long-standing history of difficulty initiating and maintaining sleep. He is currently on trazodone 150 mg each night. He is not aware of any family history of OSA. He had a tonsillectomy at age 67. Bedtime is between 10:30 and 11. He typically reads while in bed. He does not watch TV in his bedroom. Rise time is around 6:30 or 7. 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 They have no pets. He works from home for [employer]. He had chest pain this past weekend and was admitted on 10/19/2018 for observation. He was diagnosed with atypical chest pain and has a cardiology follow-up pending. He has had intermittent vertigo symptoms and complains of short-term memory issues. He denies morning headaches but has nocturia about 3 times for average night.   His Past Medical History Is Significant For: Past Medical History:  Diagnosis Date  . BPH (benign prostatic hyperplasia)   . Depression   . Diabetes mellitus without complication ([place])   . Gout   . Hyperlipidemia   . Hypertension   . Insomnia   . Low testosterone     His Past Surgical History Is Significant For: Past Surgical History:  Procedure Laterality Date  . HERNIA REPAIR      His Family History Is Significant For: Family History  Problem Relation Age of Onset  . Hypertension Other   . Atrial fibrillation Mother  had ablation    His Social History Is Significant For: Social History   Socioeconomic History  . Marital status: Married    Spouse name: Not on file  . Number of children: Not on file  . Years of education: Not on file  . Highest education level: Not on file  Occupational History  . Not on file  Social Needs  . Financial resource strain: Not on file  . Food insecurity:    Worry: Not on file    Inability: Not on file  . Transportation needs:    Medical: Not on file    Non-medical: Not on file  Tobacco Use  . Smoking  status: Never Smoker  . Smokeless tobacco: Never Used  Substance and Sexual Activity  . Alcohol use: Yes    Alcohol/week: 2.0 standard drinks    Types: 2 Cans of beer per week  . Drug use: Not Currently  . Sexual activity: Not on file  Lifestyle  . Physical activity:    Days per week: Not on file    Minutes per session: Not on file  . Stress: Not on file  Relationships  . Social connections:    Talks on phone: Not on file    Gets together: Not on file    Attends religious service: Not on file    Active member of club or organization: Not on file    Attends meetings of clubs or organizations: Not on file    Relationship status: Not on file  Other Topics Concern  . Not on file  Social History Narrative  . Not on file    His Allergies Are:  No Known Allergies:   His Current Medications Are:  Outpatient Encounter Medications as of 02/25/2019  Medication Sig  . dapagliflozin propanediol (FARXIGA) 10 MG TABS tablet Take 10 mg by mouth daily.  Marland Kitchen allopurinol (ZYLOPRIM) 300 MG tablet Take 300 mg by mouth daily.  Marland Kitchen amLODipine (NORVASC) 10 MG tablet Take 10 mg by mouth daily.  Marland Kitchen aspirin 325 MG tablet Take 325 mg by mouth daily.  Marland Kitchen buPROPion (WELLBUTRIN XL) 150 MG 24 hr tablet Take 150 mg by mouth daily.   . carvedilol (COREG) 12.5 MG tablet Take 12.5 mg by mouth 2 (two) times daily.  Marland Kitchen escitalopram (LEXAPRO) 10 MG tablet Take 10 mg by mouth daily.  . hydrochlorothiazide (HYDRODIURIL) 12.5 MG tablet Take 12.5 mg by mouth daily.  . metFORMIN (GLUCOPHAGE) 500 MG tablet Take 500 mg by mouth 2 (two) times daily with a meal.  . rosuvastatin (CRESTOR) 5 MG tablet Take 5 mg by mouth every Monday, Wednesday, and Friday.   . tamsulosin (FLOMAX) 0.4 MG CAPS capsule Take 0.4 mg by mouth daily.  Marland Kitchen telmisartan-hydrochlorothiazide (MICARDIS HCT) 80-12.5 MG tablet Take 1 tablet by mouth daily.  Marland Kitchen testosterone cypionate (DEPOTESTOSTERONE CYPIONATE) 200 MG/ML injection Inject 100 mg into the muscle  every 14 (fourteen) days.   . traZODone (DESYREL) 150 MG tablet Take 150 mg by mouth at bedtime.   No facility-administered encounter medications on file as of 02/25/2019.   :  Review of Systems:  Out of a complete 14 point review of systems, all are reviewed and negative with the exception of these symptoms as listed below:  Virtual Visit via Video Note on 02/25/2019:  I connected with Mr. Ledford on 02/25/19 at 10:30 AM EDT by a video enabled telemedicine application and verified that I am speaking with the correct person using two identifiers.   I discussed  the limitations of evaluation and management by telemedicine and the availability of in person appointments. The patient expressed understanding and agreed to proceed.  History of Present Illness: He reports generally doing well with his AutoPap, he tolerates the pressure, he uses a nasal mask but does note a leak from time to time, is a side sleeper.  He is compliant with usage but has instances of worsening nasal congestion at night and takes it off because he cannot breathe through the nose mask.  He is not very prone to allergies but has certainly noticed in the past month a flareup in the congestion, he has over-the-counter allergy medicine available including a nasal spray which he uses from time to time.  As far as symptoms, he feels that he is less tired especially when he is able to sleep through the night with the AutoPap, he has better daytime energy.  He still has quite significant nocturia and gets up 3-4 times on an average, not necessarily any better since he started his AutoPap machine.  He has had some medication changes, for better blood pressure control his amlodipine was increased and he was advised to start farxiga in lieu of glimepiride.  His latest A1c was less than 7 but had crept up a little bit from before. His weight has remained stable.   Observations/Objective: On examination, he is pleasant and conversant, in no  acute distress, good comprehension and language skills, Well oriented.  Speech is clear without dysarthria, hypophonia or voice tremor noted.  Airway examination reveals no significant mouth dryness, stable findings, tongue protrudes centrally in palate elevates symmetrically.  Face is symmetric with normal facial animation, extraocular movements well-preserved.  Upper body movements and overall coordination well-preserved.  Assessment and Plan: In summary, Kristy Catoe is a very pleasant 58 year old male with an underlying medical history of hypertension, hyperlipidemia, gout, low testosterone, diabetes, depression, BPH, recent concern for TIA, recent admission to the hospital for chest pain, and history of morbid obesity with a BMI of over 40, who Presents for a virtual, video based appointment via [email or web address] for follow-up consultation of his obstructive sleep apnea which was deemed to be in the moderate range based on his home sleep test from 11/12/2018.  His AHI was 24.7/h, O2 nadir 85%.  He is now on AutoPap therapy, set up date was 11/29/2018, he is compliant fully with respect to using it every night, percent used days greater than 4 hours was best in the month of mid March through mid April, lately a little less optimal, likely secondary to congestion and perhaps allergy flareup.  He is highly motivated to continue with treatment, does endorse improvement in his sleep quality, and daytime symptoms.  He is commended for his treatment adherence and advised to continue with the current settings, he is able to tolerate the mask and the pressure currently.  He is advised to follow-up routinely with the nurse practitioner in about 6 months for sleep, he also needs to schedule an appointment with Dr. Leonie Man in the office. I answered all his questions today and he was in agreement.  Follow Up Instructions:    I discussed the assessment and treatment plan with the patient. The patient was provided an  opportunity to ask questions and all were answered. The patient agreed with the plan and demonstrated an understanding of the instructions.   The patient was advised to call back or seek an in-person evaluation if the symptoms worsen or if the condition fails to  improve as anticipated.   I provided 15 minutes of non-face-to-face time during this encounter, including chart and data review.   Star Age, MD

## 2019-02-26 ENCOUNTER — Telehealth: Payer: Self-pay

## 2019-02-26 NOTE — Telephone Encounter (Signed)
I called pt, scheduled pt's 6 mo follow up with NP. Pt asked to also be scheduled in the office to see Dr. Leonie Man this month. He was also scheduled for this. Pt verbalized understanding of new appt dates and times.

## 2019-03-14 ENCOUNTER — Other Ambulatory Visit: Payer: Self-pay

## 2019-03-14 ENCOUNTER — Ambulatory Visit (INDEPENDENT_AMBULATORY_CARE_PROVIDER_SITE_OTHER): Payer: 59 | Admitting: Neurology

## 2019-03-14 ENCOUNTER — Encounter: Payer: Self-pay | Admitting: Neurology

## 2019-03-14 VITALS — BP 169/91 | HR 59 | Temp 97.7°F | Wt 308.2 lb

## 2019-03-14 DIAGNOSIS — G3184 Mild cognitive impairment, so stated: Secondary | ICD-10-CM

## 2019-03-14 NOTE — Patient Instructions (Signed)
I had a long discussion with the patient with regards to his remote history of TIA as well as mild cognitive impairment and mild balance difficulties and answered questions.  I recommend he continue aspirin for stroke prevention and maintain aggressive risk factor modification with strict control of hypertension with blood pressure goal below 130/90, lipids with LDL cholesterol goal below 70 mg percent, diabetes with hemoglobin A1c goal below 6.5%.  I also encouraged him to eat a healthy diet and to exercise regularly and lose weight.  He was also counseled to be compliant with his CPAP for his sleep apnea.  I also encouraged him to take a fish oil every day and to participate in cognitively challenging activities like solving crossword puzzles, playing bridge and sudoku.  Check lab work for reversible causes of memory loss and follow-up lipid profile today.  He will follow-up with Dr. Rexene Alberts for his sleep apnea and with me in the future in a year or call earlier if needed.  Memory Compensation Strategies  1. Use "WARM" strategy.  W= write it down  A= associate it  R= repeat it  M= make a mental note  2.   You can keep a Social worker.  Use a 3-ring notebook with sections for the following: calendar, important names and phone numbers,  medications, doctors' names/phone numbers, lists/reminders, and a section to journal what you did  each day.   3.    Use a calendar to write appointments down.  4.    Write yourself a schedule for the day.  This can be placed on the calendar or in a separate section of the Memory Notebook.  Keeping a  regular schedule can help memory.  5.    Use medication organizer with sections for each day or morning/evening pills.  You may need help loading it  6.    Keep a basket, or pegboard by the door.  Place items that you need to take out with you in the basket or on the pegboard.  You may also want to  include a message board for reminders.  7.    Use sticky  notes.  Place sticky notes with reminders in a place where the task is performed.  For example: " turn off the  stove" placed by the stove, "lock the door" placed on the door at eye level, " take your medications" on  the bathroom mirror or by the place where you normally take your medications.  8.    Use alarms/timers.  Use while cooking to remind yourself to check on food or as a reminder to take your medicine, or as a  reminder to make a call, or as a reminder to perform another task, etc.

## 2019-03-14 NOTE — Progress Notes (Signed)
[facility] [address] [place]. [place] [address] [phone]       OFFICE FOLLOW UP VISIT NOTE  [name] Date of Birth:  11/18/1960 Medical Record Number:  [id number]   Referring MD: Melinda Crutch  Reason for Referral: TIA HPI: Initial Consult 12/3/20219:Derek Duarte is a 58 year old Caucasian male seen today for initial office consultation visit for TIA.  History is obtained from the patient and review of referral notes as well as electronic medical records.  I personally reviewed imaging films in PACS.  He developed sudden onset of dizziness on 08/03/2018 when he bent down he had trouble getting up because he lost his balance.  He was able to sit up but felt off balance and dizzy.  He also noticed his left hand was weak and clumsy.  He has diminished fine motor skills while using his hand.  The symptoms started improving in an hour or so but he states his dizziness is still not completely recovered back to normal.  He saw his primary physician the next day who ordered an MRI scan of the brain which was done on 08/11/2018 which I personally reviewed and was normal.  Carotid ultrasound done on 08/13/2018 shows no significant extracranial stenosis.  He states he had lab work done in September this year and hemoglobin A1c was 7.2 and lipid profile was satisfactory except elevated triglycerides.  He was started on Crestor 5 mg but he takes it only 3 days a week.  He states his blood pressure is also not well controlled and today it is 161/88.  He has been started on aspirin which is tolerating well without bruising or bleeding.  He has only minor paresthesias at night occasionally from his diabetic neuropathy.  He states overall his gait and balance are fine.  He denies true vertigo, diplopia, blurred vision focal extremity numbness during this episode. Update 03/14/2019: He returns for follow-up after last visit with me in December 2019.  He continues to do well without  recurrent stroke or TIA symptoms.  He remains on aspirin which is tolerating well without bleeding or bruising.  He states his blood pressure used to do well but of late has been running high.  He has an upcoming appointment with Dr. [place] his cardiologist to discuss likely medication changes.  He states his sugar is under good control and last hemoglobin A1c was 6.7.  He remains on Crestor 5 mg 3 days a week and states his last lipid profile in September last year was all right but he is not had any recent checkup.  He has seen Dr. Rexene Alberts and diagnosed with sleep apnea and is on CPAP which is tolerating well.  He had a recent follow-up visit with her.  He continues to have mild short-term memory difficulties which are not progressive and unchanged.  He does do online brain games and does take fish oil every day.  He at times notices that he has to be careful in meeting so that he can remember everything.  He denies any family history of dementia.  He also notices that his balance at times is off particular when he is tired or tries to walk quickly.  He does have mild diabetic neuropathy but does not have significant bothersome paresthesias.  He did undergo MRA of the brain and neck on 09/21/2018 which I personally reviewed showed no significant intracranial or extracranial stenosis.  There was a focal area of decreased signal in at the origin of the  right vertebral artery which could be artifactual. ROS:   14 system review of systems is positive for hearing loss, ringing in the ears, importance, urination problems, headache, weakness, dizziness, tremor, depression, insomnia and all other symptoms systems negative  PMH:  Past Medical History:  Diagnosis Date  . BPH (benign prostatic hyperplasia)   . Depression   . Diabetes mellitus without complication ([place])   . Gout   . Hyperlipidemia   . Hypertension   . Insomnia   . Low testosterone     Social History:  Social History   Socioeconomic History   . Marital status: Married    Spouse name: Not on file  . Number of children: Not on file  . Years of education: Not on file  . Highest education level: Not on file  Occupational History  . Not on file  Social Needs  . Financial resource strain: Not on file  . Food insecurity    Worry: Not on file    Inability: Not on file  . Transportation needs    Medical: Not on file    Non-medical: Not on file  Tobacco Use  . Smoking status: Never Smoker  . Smokeless tobacco: Never Used  Substance and Sexual Activity  . Alcohol use: Yes    Alcohol/week: 2.0 standard drinks    Types: 2 Cans of beer per week  . Drug use: Not Currently  . Sexual activity: Not on file  Lifestyle  . Physical activity    Days per week: Not on file    Minutes per session: Not on file  . Stress: Not on file  Relationships  . Social Herbalist on phone: Not on file    Gets together: Not on file    Attends religious service: Not on file    Active member of club or organization: Not on file    Attends meetings of clubs or organizations: Not on file    Relationship status: Not on file  . Intimate partner violence    Fear of current or ex partner: Not on file    Emotionally abused: Not on file    Physically abused: Not on file    Forced sexual activity: Not on file  Other Topics Concern  . Not on file  Social History Narrative  . Not on file    Medications:   Current Outpatient Medications on File Prior to Visit  Medication Sig Dispense Refill  . allopurinol (ZYLOPRIM) 300 MG tablet Take 300 mg by mouth daily.    Marland Kitchen amLODipine (NORVASC) 10 MG tablet Take 10 mg by mouth daily.    Marland Kitchen aspirin 325 MG tablet Take 325 mg by mouth daily.    Marland Kitchen buPROPion (WELLBUTRIN XL) 150 MG 24 hr tablet Take 150 mg by mouth daily.   2  . carvedilol (COREG) 12.5 MG tablet Take 12.5 mg by mouth 2 (two) times daily.  4  . dapagliflozin propanediol (FARXIGA) 10 MG TABS tablet Take 10 mg by mouth daily.    Marland Kitchen escitalopram  (LEXAPRO) 10 MG tablet Take 10 mg by mouth daily.    . hydrochlorothiazide (HYDRODIURIL) 12.5 MG tablet Take 12.5 mg by mouth daily.    . metFORMIN (GLUCOPHAGE) 500 MG tablet Take 500 mg by mouth 2 (two) times daily with a meal.    . rosuvastatin (CRESTOR) 5 MG tablet Take 5 mg by mouth every Monday, Wednesday, and Friday.     . tamsulosin (FLOMAX) 0.4 MG CAPS capsule Take  0.4 mg by mouth daily.  4  . telmisartan-hydrochlorothiazide (MICARDIS HCT) 80-12.5 MG tablet Take 1 tablet by mouth daily.    Marland Kitchen testosterone cypionate (DEPOTESTOSTERONE CYPIONATE) 200 MG/ML injection Inject 100 mg into the muscle every 14 (fourteen) days.   1  . traZODone (DESYREL) 150 MG tablet Take 150 mg by mouth at bedtime.     No current facility-administered medications on file prior to visit.     Allergies:  No Known Allergies  Physical Exam General: obese middle aged Caucasian male seated, in no evident distress Head: head normocephalic and atraumatic.   Neck: supple with no carotid or supraclavicular bruits Cardiovascular: regular rate and rhythm, no murmurs Musculoskeletal: no deformity Skin:  no rash/petichiae Vascular:  Normal pulses all extremities  Neurologic Exam Mental Status: Awake and fully alert. Oriented to place and time. Recent and remote memory intact. Attention span, concentration and fund of knowledge appropriate. Mood and affect appropriate.  Cranial Nerves: Fundoscopic exam reveals sharp disc margins. Pupils equal, briskly reactive to light. Extraocular movements full without nystagmus. Visual fields full to confrontation. Hearing intact. Facial sensation intact. Face, tongue, palate moves normally and symmetrically.  Motor: Normal bulk and tone. Normal strength in all tested extremity muscles. Sensory.: intact to touch , pinprick , position and vibratory sensation.  Coordination: Rapid alternating movements normal in all extremities. Finger-to-nose and heel-to-shin performed accurately  bilaterally. Gait and Station: Arises from chair without difficulty. Stance is normal. Gait demonstrates normal stride length and balance . Able to heel, toe and tandem walk with mild difficulty.  Reflexes: 1+ and symmetric. Toes downgoing.      ASSESSMENT: 58 year old Caucasian male with transient episode of dizziness and left upper extremity incoordination and weakness likely posterior circulation TIA versus small brainstem infarct not visualized on MRI in November 2019.  Vascular risk factors of diabetes, hypertension, hyperlipidemia and obesity.  Also complains of short-term memory difficulties likely due to mild cognitive impairment.  Mild gait and balance difficulties likely due to underlying mild diabetic neuropathy.     PLAN: I had a long discussion with the patient with regards to his remote history of TIA as well as mild cognitive impairment and mild balance difficulties and answered questions.  I recommend he continue aspirin for stroke prevention and maintain aggressive risk factor modification with strict control of hypertension with blood pressure goal below 130/90, lipids with LDL cholesterol goal below 70 mg percent, diabetes with hemoglobin A1c goal below 6.5%.  I also encouraged him to eat a healthy diet and to exercise regularly and lose weight.  He was also counseled to be compliant with his CPAP for his sleep apnea.  I also encouraged him to take a fish oil every day and to participate in cognitively challenging activities like solving crossword puzzles, playing bridge and sudoku.  We also discussed memory compensation strategies.  Check lab work for reversible causes of memory loss and follow-up lipid profile today.  He will follow-up with Dr. Rexene Alberts for his sleep apnea and with me in the future in a year or call earlier if needed..Greater than 50% time during this 25-minute visit was spent on counseling and coordination of care about his TIA, mild cognitive impairment and  discussion about stroke prevention and treatment and answering questions.  Followup in the future with my nurse practitioner Megan in  6 months or call earlier if necessary Antony Contras, MD  [facility] [address] [place],  [address]  Phone [phone] Fax [phone] Note:  This document was prepared with digital dictation and possible smart phrase technology. Any transcriptional errors that result from this process are unintentional.

## 2019-03-15 LAB — LIPID PANEL
Chol/HDL Ratio: 3.7 ratio (ref 0.0–5.0)
Cholesterol, Total: 182 mg/dL (ref 100–199)
HDL: 49 mg/dL (ref >39)
LDL Calculated: 112 mg/dL — ABNORMAL HIGH (ref 0–99)
Triglycerides: 104 mg/dL (ref 0–149)
VLDL Cholesterol Cal: 21 mg/dL (ref 5–40)

## 2019-03-15 LAB — DEMENTIA PANEL
Homocysteine: 11.1 umol/L (ref 0.0–14.5)
RPR Ser Ql: NONREACTIVE
TSH: 1.89 u[IU]/mL (ref 0.450–4.500)
Vitamin B-12: 360 pg/mL (ref 232–1245)

## 2019-03-21 ENCOUNTER — Telehealth: Payer: Self-pay

## 2019-03-21 NOTE — Telephone Encounter (Signed)
I called pt and stated dementia panel labs were normal. His cholesterol was elevated and to take crestor daily rather than 3 days a week. PT stated he will wait until he sees the cardiologist before adjusting his crestor. Pt was given IT number for mychart to see why he cannot see current test and lab results. Pt verbalized understanding.

## 2019-03-21 NOTE — Telephone Encounter (Signed)
Pt would like labwork results °

## 2019-03-21 NOTE — Telephone Encounter (Signed)
Kindly inform the patient that lab work for reversible causes of memory loss was all satisfactory.  His bad cholesterol was elevated and he needs to take Crestor every day rather than 3 days a week like he is currently taking.

## 2019-03-24 NOTE — Progress Notes (Signed)
Cardiology Office Note   Date:  03/26/2019   ID:  Derek Duarte, DOB [birth year], MRN [id number]  PCP:  Lawerance Cruel, MD  Cardiologist:  Raegan Sipp Martinique MD Chief Complaint  Patient presents with  . Hypertension     History of Present Illness: Derek Duarte is a 58 y.o. male who presents for follow up atypical chest pain and HTN. He was admitted in January 2020 for chest pain after being seen by PCP for abnormal EKG and chest discomfort. He has a history of HTN, HL, Diabetes, and TIA.   He ruled out for ACS with negative troponin, and EKG, but was found to have hypokalemia at 2.7.  Chest pain was found to be atypical. He was not felt he needed further cardiac at this time. He was to control CVRF and increase his exercise. He had a Zio monitor at [facility] which showed rare PVCs and bigeminy and symptoms did not correlate with arrhythmia. He has been evaluated by Dr Leonie Man for history of TIA and some Neurologic complaints. He did have a sleep study done showing OSA and is now on CPAP.   He did have a Myoview study in February 2020 showing no ischemia or infarct. EF was reported low but he also had an Echo 2 months earlier showing normal EF.   His BP has been difficult to control. Recently added Cardura 2 mg daily. He is planning on going on a Keto diet to lose weight. He is walking daily. Motivated to make lifestyle changes. Only taking Crestor 3x/wk but tolerating well.   Past Medical History:  Diagnosis Date  . BPH (benign prostatic hyperplasia)   . Depression   . Diabetes mellitus without complication ([place])   . Gout   . Hyperlipidemia   . Hypertension   . Insomnia   . Low testosterone     Past Surgical History:  Procedure Laterality Date  . HERNIA REPAIR       Current Outpatient Medications  Medication Sig Dispense Refill  . allopurinol (ZYLOPRIM) 300 MG tablet Take 300 mg by mouth daily.    Marland Kitchen amLODipine (NORVASC) 10 MG tablet Take 10 mg by mouth daily.    Marland Kitchen aspirin  325 MG tablet Take 325 mg by mouth daily.    Marland Kitchen buPROPion (WELLBUTRIN XL) 150 MG 24 hr tablet Take 150 mg by mouth daily.   2  . carvedilol (COREG) 12.5 MG tablet Take 25 mg by mouth 2 (two) times daily.   4  . dapagliflozin propanediol (FARXIGA) 10 MG TABS tablet Take 10 mg by mouth daily.    Marland Kitchen escitalopram (LEXAPRO) 10 MG tablet Take 10 mg by mouth daily.    . hydrochlorothiazide (HYDRODIURIL) 12.5 MG tablet Take 12.5 mg by mouth daily.    . metFORMIN (GLUCOPHAGE) 500 MG tablet Take 500 mg by mouth 2 (two) times daily with a meal.    . rosuvastatin (CRESTOR) 5 MG tablet Take 1 tablet (5 mg total) by mouth daily. 90 tablet 3  . tamsulosin (FLOMAX) 0.4 MG CAPS capsule Take 0.4 mg by mouth daily.  4  . telmisartan-hydrochlorothiazide (MICARDIS HCT) 80-12.5 MG tablet Take 1 tablet by mouth daily.    . traZODone (DESYREL) 150 MG tablet Take 150 mg by mouth at bedtime.    Marland Kitchen doxazosin (CARDURA) 2 MG tablet Take 1 tablet (2 mg total) by mouth daily.    Marland Kitchen testosterone cypionate (DEPOTESTOSTERONE CYPIONATE) 200 MG/ML injection Inject 100 mg into the muscle every 14 (fourteen) days.  1   No current facility-administered medications for this visit.     Allergies:   Patient has no known allergies.    Social History:  The patient  reports that he has never smoked. He has never used smokeless tobacco. He reports current alcohol use of about 2.0 standard drinks of alcohol per week. He reports previous drug use.   Family History:  The patient's family history includes Atrial fibrillation in his mother; Hypertension in an other family member.    ROS: All other systems are reviewed and negative. Unless otherwise mentioned in H&P    PHYSICAL EXAM: VS:  BP (!) 156/90   Pulse (!) 111   Temp 98.4 F (36.9 C)   Ht 6' (1.829 m)   Wt (!) 303 lb 9.6 oz (137.7 kg)   SpO2 93%   BMI 41.18 kg/m  , BMI Body mass index is 41.18 kg/m. GEN: Well nourished, obese, in no acute distress, obese  HEENT: normal  Neck: no JVD, carotid bruits, or masses Cardiac: RRR; no murmurs, rubs, or gallops,no edema  Respiratory:  Clear to auscultation bilaterally, normal work of breathing GI: soft, nontender, nondistended, + BS MS: no deformity or atrophy Skin: warm and dry, no rash Neuro:  Strength and sensation are intact Psych: euthymic mood, full affect   EKG:  NSR rate 56. Otherwise normal. I have personally reviewed and interpreted this study.  Recent Labs: 10/19/2018: BUN 14; Creatinine, Ser 1.27; Hemoglobin 14.7; Magnesium 1.9; Platelets 212; Potassium 2.7; Sodium 140 03/14/2019: TSH 1.890    Lipid Panel    Component Value Date/Time   CHOL 182 03/14/2019 0916   TRIG 104 03/14/2019 0916   HDL 49 03/14/2019 0916   CHOLHDL 3.7 03/14/2019 0916   LDLCALC 112 (H) 03/14/2019 0916    dated 12/04/18: A1c 6.8%  Wt Readings from Last 3 Encounters:  03/26/19 (!) 303 lb 9.6 oz (137.7 kg)  03/14/19 (!) 308 lb 3.2 oz (139.8 kg)  11/14/18 (!) 303 lb (137.4 kg)      Other studies Reviewed: 2D Echo 09/08/19 Study Conclusions  - Left ventricle: The cavity size was normal. Wall thickness was increased in a pattern of severe LVH. Systolic function was normal. The estimated ejection fraction was in the range of 55% to 60%. Wall motion was normal; there were no regional wall motion abnormalities. Doppler parameters are consistent with abnormal left ventricular relaxation (grade 1 diastolic dysfunction). - Aortic valve: Trileaflet; mildly thickened, mildly calcified leaflets. - Left atrium: The atrium was mildly dilated. - Right ventricle: The cavity size was mildly dilated. Wall thickness was normal.  Impressions:  - No cardiac source of emboli was indentified.  Myoview 11/15/18: Study Highlights    Blood pressure demonstrated a hypertensive response to exercise.  Nuclear stress EF: 44%.  There was no ST segment deviation noted during stress.  The left ventricular  ejection fraction is moderately decreased (30-44%).  This is an intermediate risk study.   Normal resting and stress perfusion. No ischemia or infarction EF EF estimated at 44% with LVE and diffuse hypokinesis suggest TTE/MRI correlation     ASSESSMENT AND PLAN:  1. Hypertension: refractory. On  telmisartan/HCTZ, coreg BID and amlodipine. Recent addition of Cardura- recommend he take this at night. Hopefully with lifestyle modification/weight loss/exercise/ and treatment of OSA BP will improve.    2. TIA: Zio monitor was placed by [facility].  He had rare PVC's but no pauses or atrial fib. On ASA 325 mg daily per Neuro.  3. Hyperlipidemia. LDL 112. Recommend he take Crestor daily.  4. Atypical chest pain. Resolved. Normal Myoview study.    Current medicines are reviewed at length with the patient today.    Labs/ tests ordered today include: none  Follow up 6 months.   Aibhlinn Kalmar Martinique MD, [facility]      03/26/2019 3:18 PM    [address] Office [phone] Fax [phone]

## 2019-03-26 ENCOUNTER — Ambulatory Visit (INDEPENDENT_AMBULATORY_CARE_PROVIDER_SITE_OTHER): Payer: 59 | Admitting: Cardiology

## 2019-03-26 ENCOUNTER — Encounter: Payer: Self-pay | Admitting: Cardiology

## 2019-03-26 ENCOUNTER — Other Ambulatory Visit: Payer: Self-pay

## 2019-03-26 VITALS — BP 156/90 | HR 111 | Temp 98.4°F | Ht 72.0 in | Wt 303.6 lb

## 2019-03-26 DIAGNOSIS — I10 Essential (primary) hypertension: Secondary | ICD-10-CM | POA: Diagnosis not present

## 2019-03-26 MED ORDER — ROSUVASTATIN CALCIUM 5 MG PO TABS: 5.0000 mg | ORAL_TABLET | Freq: Every day | ORAL | 3 refills | Status: DC

## 2019-03-26 MED ORDER — DOXAZOSIN MESYLATE 2 MG PO TABS: 2.0000 mg | ORAL_TABLET | Freq: Every day | ORAL | Status: DC

## 2019-03-26 NOTE — Patient Instructions (Signed)
Increase Crestor to 5 mg daily  Continue your other therapy and CPAP  Continue your exercise program and weight loss.

## 2019-09-01 ENCOUNTER — Encounter: Payer: Self-pay | Admitting: Neurology

## 2019-09-03 ENCOUNTER — Other Ambulatory Visit: Payer: Self-pay

## 2019-09-03 ENCOUNTER — Ambulatory Visit (INDEPENDENT_AMBULATORY_CARE_PROVIDER_SITE_OTHER): Payer: 59 | Admitting: Adult Health

## 2019-09-03 ENCOUNTER — Encounter: Payer: Self-pay | Admitting: Adult Health

## 2019-09-03 VITALS — BP 170/86 | HR 54 | Temp 97.2°F | Ht 72.0 in | Wt 276.6 lb

## 2019-09-03 DIAGNOSIS — Z9989 Dependence on other enabling machines and devices: Secondary | ICD-10-CM | POA: Diagnosis not present

## 2019-09-03 DIAGNOSIS — G4733 Obstructive sleep apnea (adult) (pediatric): Secondary | ICD-10-CM | POA: Diagnosis not present

## 2019-09-03 NOTE — Progress Notes (Addendum)
PATIENT: Derek Duarte DOB: 1961-09-18  REASON FOR VISIT: follow up HISTORY FROM: patient  HISTORY OF PRESENT ILLNESS: Today 09/03/19: Derek Duarte is a 58 year old male with a history of obstructive sleep apnea on CPAP.  He returns today for follow-up.  His download indicates that he uses machine 29 out of 30 days for compliance of 97%.  He uses machine greater than 4 hours 9 days for compliance of 30%.  He states that he still has a lot of congestion at night and often has to take off the mask.  He has not seen his primary care or ENT for congestion.  His residual AHI is 0.3 on 7 to 14 cm of water with EPR of 2.  His leak in the 95th percentile is 30.7 L/min.  He states that he does not feel the machine leaking at night.  He also states that he is not seen a big change in how he feels after using the CPAP.  He returns today for an evaluation.   HISTORY , 02/25/2019:  I reviewed his AutoPap compliance data from 01/22/2019 through 02/20/2019 which is a total of 30 days, during which time he used his AutoPap every night with percent used days greater than 4 hours at 60%, indicating suboptimal compliance with an average usage for days on treatment of 4 hours and 29 minutes, residual AHI at goal at 0.3/h, 95th percentile of pressure at 13.3 cm, leak on the high side with a 95th percentile at 26.3 L/min.  Set up date was 11/29/2018.  His compliance from 12/16/2018 through 01/14/2019 was better, percent used days greater than 4 hours at 77% at the time, average usage a little better at 4 hours and 35 minutes.   REVIEW OF SYSTEMS: Out of a complete 14 system review of symptoms, the patient complains only of the following symptoms, and all other reviewed systems are negative.  ESS 3 FSS 37  ALLERGIES: No Known Allergies  HOME MEDICATIONS: Outpatient Medications Prior to Visit  Medication Sig Dispense Refill  . allopurinol (ZYLOPRIM) 300 MG tablet Take 300 mg by mouth daily.    Marland Kitchen amLODipine  (NORVASC) 10 MG tablet Take 10 mg by mouth daily.    Marland Kitchen aspirin 325 MG tablet Take 325 mg by mouth daily.    Marland Kitchen buPROPion (WELLBUTRIN XL) 150 MG 24 hr tablet Take 150 mg by mouth daily.   2  . carvedilol (COREG) 12.5 MG tablet Take 25 mg by mouth 2 (two) times daily.   4  . dapagliflozin propanediol (FARXIGA) 10 MG TABS tablet Take 10 mg by mouth daily.    Marland Kitchen doxazosin (CARDURA) 2 MG tablet Take 1 tablet (2 mg total) by mouth daily.    Marland Kitchen escitalopram (LEXAPRO) 10 MG tablet Take 10 mg by mouth daily.    . hydrochlorothiazide (HYDRODIURIL) 12.5 MG tablet Take 12.5 mg by mouth daily.    . metFORMIN (GLUCOPHAGE) 500 MG tablet Take 500 mg by mouth 2 (two) times daily with a meal. 1 tab 2x a day.    . rosuvastatin (CRESTOR) 5 MG tablet Take 1 tablet (5 mg total) by mouth daily. 90 tablet 3  . tamsulosin (FLOMAX) 0.4 MG CAPS capsule Take 0.4 mg by mouth daily.  4  . telmisartan-hydrochlorothiazide (MICARDIS HCT) 80-12.5 MG tablet Take 1 tablet by mouth daily.    Marland Kitchen testosterone cypionate (DEPOTESTOSTERONE CYPIONATE) 200 MG/ML injection Inject 100 mg into the muscle every 14 (fourteen) days.   1  . traZODone (DESYREL)  150 MG tablet Take 150 mg by mouth at bedtime.     No facility-administered medications prior to visit.     PAST MEDICAL HISTORY: Past Medical History:  Diagnosis Date  . BPH (benign prostatic hyperplasia)   . Depression   . Diabetes mellitus without complication ([place])   . Gout   . Hyperlipidemia   . Hypertension   . Insomnia   . Low testosterone     PAST SURGICAL HISTORY: Past Surgical History:  Procedure Laterality Date  . HERNIA REPAIR      FAMILY HISTORY: Family History  Problem Relation Age of Onset  . Hypertension Other   . Atrial fibrillation Mother        had ablation    SOCIAL HISTORY: Social History   Socioeconomic History  . Marital status: Married    Spouse name: Not on file  . Number of children: Not on file  . Years of education: Not on file  .  Highest education level: Not on file  Occupational History  . Not on file  Social Needs  . Financial resource strain: Not on file  . Food insecurity    Worry: Not on file    Inability: Not on file  . Transportation needs    Medical: Not on file    Non-medical: Not on file  Tobacco Use  . Smoking status: Never Smoker  . Smokeless tobacco: Never Used  Substance and Sexual Activity  . Alcohol use: Yes    Alcohol/week: 2.0 standard drinks    Types: 2 Cans of beer per week  . Drug use: Not Currently  . Sexual activity: Not on file  Lifestyle  . Physical activity    Days per week: Not on file    Minutes per session: Not on file  . Stress: Not on file  Relationships  . Social Herbalist on phone: Not on file    Gets together: Not on file    Attends religious service: Not on file    Active member of club or organization: Not on file    Attends meetings of clubs or organizations: Not on file    Relationship status: Not on file  . Intimate partner violence    Fear of current or ex partner: Not on file    Emotionally abused: Not on file    Physically abused: Not on file    Forced sexual activity: Not on file  Other Topics Concern  . Not on file  Social History Narrative  . Not on file      PHYSICAL EXAM  Vitals:   09/03/19 0823  BP: (!) 170/86  Pulse: (!) 54  Temp: (!) 97.2 F (36.2 C)  Weight: 276 lb 9.6 oz (125.5 kg)  Height: 6' (1.829 m)   Body mass index is 37.51 kg/m.  Generalized: Well developed, in no acute distress  Chest: Lungs clear to auscultation bilaterally  Neurological examination  Mentation: Alert oriented to time, place, history taking. Follows all commands speech and language fluent Cranial nerve II-XII: Extraocular movements were full, visual field were full on confrontational test Head turning and shoulder shrug  were normal and symmetric. Motor: The motor testing reveals 5 over 5 strength of all 4 extremities. Good symmetric motor  tone is noted throughout.  Sensory: Sensory testing is intact to soft touch on all 4 extremities. No evidence of extinction is noted.  Gait and station: Gait is normal.    DIAGNOSTIC DATA (LABS, IMAGING, TESTING) -  I reviewed patient records, labs, notes, testing and imaging myself where available.  Lab Results  Component Value Date   WBC 9.0 10/19/2018   HGB 14.7 10/19/2018   HCT 42.5 10/19/2018   MCV 83.5 10/19/2018   PLT 212 10/19/2018      Component Value Date/Time   NA 140 10/19/2018 1152   NA 145 09/29/2011 1144   K 2.7 (LL) 10/19/2018 1152   K 3.5 09/29/2011 1144   CL 99 10/19/2018 1152   CL 106 09/29/2011 1144   CO2 30 10/19/2018 1152   CO2 29 09/29/2011 1144   GLUCOSE 228 (H) 10/19/2018 1152   GLUCOSE 142 (H) 09/29/2011 1144   BUN 14 10/19/2018 1152   BUN 17 09/29/2011 1144   CREATININE 1.27 (H) 10/19/2018 1152   CREATININE 0.84 09/29/2011 1144   CALCIUM 9.2 10/19/2018 1152   CALCIUM 9.1 09/29/2011 1144   GFRNONAA >60 10/19/2018 1152   GFRNONAA >60 09/29/2011 1144   GFRAA >60 10/19/2018 1152   GFRAA >60 09/29/2011 1144   Lab Results  Component Value Date   CHOL 182 03/14/2019   HDL 49 03/14/2019   LDLCALC 112 (H) 03/14/2019   TRIG 104 03/14/2019   CHOLHDL 3.7 03/14/2019    Lab Results  Component Value Date   VITAMINB12 360 03/14/2019   Lab Results  Component Value Date   TSH 1.890 03/14/2019      ASSESSMENT AND PLAN 58 y.o. year old male  has a past medical history of BPH (benign prostatic hyperplasia), Depression, Diabetes mellitus without complication ([place]), Gout, Hyperlipidemia, Hypertension, Insomnia, and Low testosterone. here with:  1. Obstructive sleep apnea on CPAP  The patient's CPAP download shows suboptimal compliance but good treatment of his apnea.  He is encouraged to continue using CPAP nightly and greater than 4 hours each night.  He was encouraged to follow-up with primary care or ENT for continued congestion.  He is advised  that if his symptoms worsen or he develops new symptoms he should let us know.  He will follow-up in 1 year or sooner if needed    I spent 15 minutes with the patient. 50% of this time was spent reviewing CPAP download   Ward Givens, MSN, NP-C 09/03/2019, 8:42 AM [facility] [address] [place], [place] [address] [phone]  I reviewed the above note and documentation by the Nurse Practitioner and agree with the history, exam, assessment and plan as outlined above. I was available for consultation. Star Age, MD, PhD [facility])

## 2019-09-03 NOTE — Patient Instructions (Signed)

## 2019-10-10 ENCOUNTER — Encounter: Payer: Self-pay | Admitting: Physician Assistant

## 2019-10-10 ENCOUNTER — Other Ambulatory Visit: Payer: Self-pay

## 2019-10-10 ENCOUNTER — Ambulatory Visit (INDEPENDENT_AMBULATORY_CARE_PROVIDER_SITE_OTHER): Payer: 59 | Admitting: Physician Assistant

## 2019-10-10 VITALS — BP 158/92 | HR 97 | Temp 97.9°F | Ht 72.0 in | Wt 283.4 lb

## 2019-10-10 DIAGNOSIS — I10 Essential (primary) hypertension: Secondary | ICD-10-CM

## 2019-10-10 DIAGNOSIS — E119 Type 2 diabetes mellitus without complications: Secondary | ICD-10-CM | POA: Diagnosis not present

## 2019-10-10 DIAGNOSIS — G4733 Obstructive sleep apnea (adult) (pediatric): Secondary | ICD-10-CM | POA: Diagnosis not present

## 2019-10-10 DIAGNOSIS — E785 Hyperlipidemia, unspecified: Secondary | ICD-10-CM | POA: Diagnosis not present

## 2019-10-10 MED ORDER — DOXAZOSIN MESYLATE 4 MG PO TABS: 4.0000 mg | ORAL_TABLET | Freq: Every day | ORAL | 0 refills | Status: DC

## 2019-10-10 NOTE — Patient Instructions (Signed)
Medication Instructions:   INCREASE CARDURA TO 4 MG DAILY  *If you need a refill on your cardiac medications before your next appointment, please call your pharmacy*  Lab Work: NONE ordered at this time of appointment   If you have labs (blood work) drawn today and your tests are completely normal, you will receive your results only by: Marland Kitchen MyChart Message (if you have MyChart) OR . A paper copy in the mail If you have any lab test that is abnormal or we need to change your treatment, we will call you to review the results.  Testing/Procedures: NONE ordered at this time of appointment   Follow-Up: At [facility], you and your health needs are our priority.  As part of our continuing mission to provide you with exceptional heart care, we have created designated Provider Care Teams.  These Care Teams include your primary Cardiologist (physician) and Advanced Practice Providers (APPs -  Physician Assistants and Nurse Practitioners) who all work together to provide you with the care you need, when you need it.  Your next appointment:   2-3 month(s)  The format for your next appointment:   Virtual Visit   Provider:   Peter Martinique, MD  Other Instructions  MONITOR BLOOD PRESSURE AT LEAST 2-3 TIMES A WEEK DAILY IF POSSIBLE

## 2019-10-10 NOTE — Progress Notes (Signed)
Cardiology Office Note:    Date:  10/12/2019   ID:  Derek Duarte, DOB Mar 28, 1961, MRN [id number]  PCP:  Lawerance Cruel, MD  Cardiologist:  Peter Martinique, MD  Electrophysiologist:  None   Referring MD: Lawerance Cruel, MD   Chief Complaint  Patient presents with  . Follow-up    seen for Dr. Martinique.    History of Present Illness:    Derek Duarte is a 59 y.o. male with a hx of hypertension, hyperlipidemia, DM 2, TIA, OSA on CPAP and atypical chest pain.  Last echocardiogram obtained on 09/07/2018 showed EF 55%, severe LVH, grade 1 DD, mild LAE, otherwise no significant valve issue.  Patient was admitted in January 2020 for chest pain and abnormal EKG.  He was ruled out by troponin and EKG.  Lab work was concerning for severe hypokalemia at 2.7.  He had a Zio monitor at [facility] that showed rare PVC and bigeminy, otherwise no significant arrhythmia.  Myoview study in February 2020 showed no ischemia or infarction.  He has difficult to control blood pressure, Cardura 2 mg daily was added to his medical regimen.  Patient presents today for cardiology office visit.  He has not had any recent chest pain.  He is on the keto diet, which likely explains why his LDL was quite high last year.  I encouraged him to have a repeat lipid panel and once he come off of keto diet.  He is no longer on Crestor, he is only on once weekly dosing on pravastatin.  This likely will not control his cholesterol level.  His blood pressure remains elevated.  He takes carvedilol, Cardura, and telmisartan-HCTZ a.m., he take amlodipine and carvedilol at night.  Blood pressure continue to be elevated, I will increase Cardura to 4 mg daily.  He will need virtual visit in 2 to 3 months to manage blood pressure and titrate medication.   Past Medical History:  Diagnosis Date  . BPH (benign prostatic hyperplasia)   . Depression   . Diabetes mellitus without complication ([place])   . Gout   .  Hyperlipidemia   . Hypertension   . Insomnia   . Low testosterone     Past Surgical History:  Procedure Laterality Date  . HERNIA REPAIR      Current Medications: Current Meds  Medication Sig  . allopurinol (ZYLOPRIM) 300 MG tablet Take 300 mg by mouth daily.  Marland Kitchen amLODipine (NORVASC) 5 MG tablet Take 5 mg by mouth daily.  Marland Kitchen aspirin 325 MG tablet Take 325 mg by mouth daily.  Marland Kitchen buPROPion (WELLBUTRIN XL) 150 MG 24 hr tablet Take 150 mg by mouth daily.   . carvedilol (COREG) 25 MG tablet Take 25 mg by mouth 2 (two) times daily.  . dapagliflozin propanediol (FARXIGA) 10 MG TABS tablet Take 10 mg by mouth daily.  Marland Kitchen doxazosin (CARDURA) 4 MG tablet Take 1 tablet (4 mg total) by mouth daily.  Marland Kitchen escitalopram (LEXAPRO) 10 MG tablet Take 10 mg by mouth daily.  . metFORMIN (GLUCOPHAGE) 500 MG tablet Take 500 mg by mouth 2 (two) times daily with a meal. 1 tab 2x a day.  . pravastatin (PRAVACHOL) 20 MG tablet Take 20 mg by mouth once a week.  . tamsulosin (FLOMAX) 0.4 MG CAPS capsule Take 0.4 mg by mouth daily.  Marland Kitchen telmisartan-hydrochlorothiazide (MICARDIS HCT) 80-12.5 MG tablet Take 1 tablet by mouth daily.  Marland Kitchen testosterone cypionate (DEPOTESTOSTERONE CYPIONATE) 200 MG/ML injection Inject 100 mg into  the muscle every 14 (fourteen) days.   . traZODone (DESYREL) 150 MG tablet Take 150 mg by mouth at bedtime.  . [DISCONTINUED] carvedilol (COREG) 12.5 MG tablet Take 25 mg by mouth 2 (two) times daily.   . [DISCONTINUED] doxazosin (CARDURA) 2 MG tablet Take 1 tablet (2 mg total) by mouth daily.  . [DISCONTINUED] rosuvastatin (CRESTOR) 5 MG tablet Take 1 tablet (5 mg total) by mouth daily.     Allergies:   Patient has no known allergies.   Social History   Socioeconomic History  . Marital status: Married    Spouse name: Not on file  . Number of children: Not on file  . Years of education: Not on file  . Highest education level: Not on file  Occupational History  . Not on file  Tobacco Use  .  Smoking status: Never Smoker  . Smokeless tobacco: Never Used  Substance and Sexual Activity  . Alcohol use: Yes    Alcohol/week: 2.0 standard drinks    Types: 2 Cans of beer per week  . Drug use: Not Currently  . Sexual activity: Not on file  Other Topics Concern  . Not on file  Social History Narrative  . Not on file   Social Determinants of Health   Financial Resource Strain:   . Difficulty of Paying Living Expenses: Not on file  Food Insecurity:   . Worried About Charity fundraiser in the Last Year: Not on file  . Ran Out of Food in the Last Year: Not on file  Transportation Needs:   . Lack of Transportation (Medical): Not on file  . Lack of Transportation (Non-Medical): Not on file  Physical Activity:   . Days of Exercise per Week: Not on file  . Minutes of Exercise per Session: Not on file  Stress:   . Feeling of Stress : Not on file  Social Connections:   . Frequency of Communication with Friends and Family: Not on file  . Frequency of Social Gatherings with Friends and Family: Not on file  . Attends Religious Services: Not on file  . Active Member of Clubs or Organizations: Not on file  . Attends Archivist Meetings: Not on file  . Marital Status: Not on file     Family History: The patient's family history includes Atrial fibrillation in his mother; Hypertension in an other family member.  ROS:   Please see the history of present illness.     All other systems reviewed and are negative.  EKGs/Labs/Other Studies Reviewed:    The following studies were reviewed today:  Echo 09/07/2018 Study Conclusions  - Left ventricle: The cavity size was normal. Wall thickness was   increased in a pattern of severe LVH. Systolic function was   normal. The estimated ejection fraction was in the range of 55%   to 60%. Wall motion was normal; there were no regional wall   motion abnormalities. Doppler parameters are consistent with   abnormal left ventricular  relaxation (grade 1 diastolic   dysfunction). - Aortic valve: Trileaflet; mildly thickened, mildly calcified   leaflets. - Left atrium: The atrium was mildly dilated. - Right ventricle: The cavity size was mildly dilated. Wall   thickness was normal.  Impressions:  - No cardiac source of emboli was indentified.   Myoview 11/15/2018  Blood pressure demonstrated a hypertensive response to exercise.  Nuclear stress EF: 44%.  There was no ST segment deviation noted during stress.  The left ventricular  ejection fraction is moderately decreased (30-44%).  This is an intermediate risk study.   Normal resting and stress perfusion. No ischemia or infarction EF EF estimated at 44% with LVE and diffuse hypokinesis suggest TTE/MRI correlation    EKG:  EKG is not ordered today.    Recent Labs: 10/19/2018: BUN 14; Creatinine, Ser 1.27; Hemoglobin 14.7; Magnesium 1.9; Platelets 212; Potassium 2.7; Sodium 140 03/14/2019: TSH 1.890  Recent Lipid Panel    Component Value Date/Time   CHOL 182 03/14/2019 0916   TRIG 104 03/14/2019 0916   HDL 49 03/14/2019 0916   CHOLHDL 3.7 03/14/2019 0916   LDLCALC 112 (H) 03/14/2019 0916    Physical Exam:    VS:  BP (!) 158/92   Pulse 97   Temp 97.9 F (36.6 C)   Ht 6' (1.829 m)   Wt 283 lb 6.4 oz (128.5 kg)   SpO2 95%   BMI 38.44 kg/m     Wt Readings from Last 3 Encounters:  10/10/19 283 lb 6.4 oz (128.5 kg)  09/03/19 276 lb 9.6 oz (125.5 kg)  03/26/19 (!) 303 lb 9.6 oz (137.7 kg)     GEN:  Well nourished, well developed in no acute distress HEENT: Normal NECK: No JVD; No carotid bruits LYMPHATICS: No lymphadenopathy CARDIAC: RRR, no murmurs, rubs, gallops RESPIRATORY:  Clear to auscultation without rales, wheezing or rhonchi  ABDOMEN: Soft, non-tender, non-distended MUSCULOSKELETAL:  No edema; No deformity  SKIN: Warm and dry NEUROLOGIC:  Alert and oriented x 3 PSYCHIATRIC:  Normal affect   ASSESSMENT:    1. Essential  hypertension   2. Hyperlipidemia LDL goal <70   3. Controlled type 2 diabetes mellitus without complication, without long-term current use of insulin ([place])   4. OSA (obstructive sleep apnea)   5. Obesity, Class III, BMI 40-49.9 (morbid obesity) ([place])    PLAN:    In order of problems listed above:  1. Hypertension: Blood pressure remains elevated today.  Increase Cardura to 4 mg daily  2. Hyperlipidemia: Cholesterol is uncontrolled.  He is only on pravastatin once weekly dosing.  He is no longer on Crestor.  He has been using ketogenic diet to help with weight loss.  This is likely causing her cholesterol to be quite elevated.  3. DM2: Managed by primary care provider  4. Obstructive sleep apnea: Continue CPAP therapy.  5. Obesity: He has been on ketogenic diet for the past several months.   Medication Adjustments/Labs and Tests Ordered: Current medicines are reviewed at length with the patient today.  Concerns regarding medicines are outlined above.  No orders of the defined types were placed in this encounter.  Meds ordered this encounter  Medications  . doxazosin (CARDURA) 4 MG tablet    Sig: Take 1 tablet (4 mg total) by mouth daily.    Dispense:  90 tablet    Refill:  0    Patient Instructions  Medication Instructions:   INCREASE CARDURA TO 4 MG DAILY  *If you need a refill on your cardiac medications before your next appointment, please call your pharmacy*  Lab Work: NONE ordered at this time of appointment   If you have labs (blood work) drawn today and your tests are completely normal, you will receive your results only by: Marland Kitchen MyChart Message (if you have MyChart) OR . A paper copy in the mail If you have any lab test that is abnormal or we need to change your treatment, we will call you to review the results.  Testing/Procedures: NONE ordered at this time of appointment   Follow-Up: At [facility], you and your health needs are our priority.  As part of  our continuing mission to provide you with exceptional heart care, we have created designated Provider Care Teams.  These Care Teams include your primary Cardiologist (physician) and Advanced Practice Providers (APPs -  Physician Assistants and Nurse Practitioners) who all work together to provide you with the care you need, when you need it.  Your next appointment:   2-3 month(s)  The format for your next appointment:   Virtual Visit   Provider:   Peter Martinique, MD  Other Instructions  MONITOR BLOOD PRESSURE AT LEAST 2-3 TIMES A WEEK DAILY IF POSSIBLE     Signed, Almyra Deforest, [place]  10/12/2019 11:26 PM    [place]

## 2019-10-12 ENCOUNTER — Encounter: Payer: Self-pay | Admitting: Physician Assistant

## 2019-11-15 MED ORDER — AMLODIPINE BESYLATE 10 MG PO TABS: 10.0000 mg | ORAL_TABLET | Freq: Every day | ORAL | 3 refills | Status: DC

## 2019-11-15 NOTE — Telephone Encounter (Signed)
Spoke with patient.  He notes that most days since seeing Isaac Laud, his systolic pressure has been in the 140's and occasional 150's.  This week has been more stressful and is now higher.  Has been using meditation and deep breathing this morning, currently back down to 147/95.    Advised patient to increase amlodipine to 10 mg daily.  Might take a few days for his pressure to stabilize at lower readings.  Patient voiced understanding

## 2019-11-19 ENCOUNTER — Telehealth: Payer: Self-pay | Admitting: Cardiology

## 2019-11-19 ENCOUNTER — Ambulatory Visit (INDEPENDENT_AMBULATORY_CARE_PROVIDER_SITE_OTHER): Payer: 59 | Admitting: Pharmacist Clinician (PhC)/ Clinical Pharmacy Specialist

## 2019-11-19 ENCOUNTER — Other Ambulatory Visit: Payer: Self-pay

## 2019-11-19 ENCOUNTER — Encounter: Payer: Self-pay | Admitting: Pharmacist Clinician (PhC)/ Clinical Pharmacy Specialist

## 2019-11-19 VITALS — BP 168/88 | HR 68

## 2019-11-19 DIAGNOSIS — I10 Essential (primary) hypertension: Secondary | ICD-10-CM

## 2019-11-19 NOTE — Patient Instructions (Signed)
Return for a a follow up appointment in 3 weeks  Go to the lab today to check potassium levels  Your blood pressure today is 168/88  Check your blood pressure at home daily and keep record of the readings.  Take your BP meds as follows:  We will call you with results of the lab work to determine best medications    Bring all of your meds, your BP cuff and your record of home blood pressures to your next appointment.  Exercise as you're able, try to walk approximately 30 minutes per day.  Keep salt intake to a minimum, especially watch canned and prepared boxed foods.  Eat more fresh fruits and vegetables and fewer canned items.  Avoid eating in fast food restaurants.    HOW TO TAKE YOUR BLOOD PRESSURE: . Rest 5 minutes before taking your blood pressure. .  Don't smoke or drink caffeinated beverages for at least 30 minutes before. . Take your blood pressure before (not after) you eat. . Sit comfortably with your back supported and both feet on the floor (don't cross your legs). . Elevate your arm to heart level on a table or a desk. . Use the proper sized cuff. It should fit smoothly and snugly around your bare upper arm. There should be enough room to slip a fingertip under the cuff. The bottom edge of the cuff should be 1 inch above the crease of the elbow. . Ideally, take 3 measurements at one sitting and record the average.

## 2019-11-19 NOTE — Assessment & Plan Note (Signed)
Patient with mixed systolic/diastolic hypertension, currently not at goal despite compliance with 5 medications.  Patient has history of low potassium levels, which could potentially be a cause of hypertension.  Will have him get metabolic panel today to see where levels currently are.  Will most likely consider adding potassium supplementation on daily long term basis and see if this will help bring readings back to WNL.  If readings remain high despite supplementation, would consider adding spironolactone at next appointment, for dual purpose of increasing potassium and decreasing blood pressure.  Patient aware that we will call him tomorrow when results are in, to determine dose of potassium needed.

## 2019-11-19 NOTE — Progress Notes (Signed)
11/19/2019 Derek Duarte [birth year] [id number]   HPI:  Derek Duarte is a 59 y.o. male patient of Derek Duarte, with a [place] below who presents today for hypertension clinic evaluation.  Patient notes a history of hypertension going back to his 20's.  Has been difficult to control at times, with occasional readings into the [id number] systolic that may last for a day or two, then drop back to a more normal range.  He saw Derek Duarte in January and his doxazosin was increased from 2 to 4 mg daily.  He sent in a My Chart message about a month later noting that his pressure was elevated to the 170-190 range and was not coming down.  Spoke to patient on phone and increased amlodipine to 10 mg daily.  He called in today to report that pressure still unchanged 4 days later and was given appointment time today.    Derek Duarte notes that when his blood pressure is elevated he tends to have headaches.  This is something he notes his father and paternal grandmother also had a history of.  He has a fairly new (59 yrs old) [employer] brand BP cuff and last checked it at his PCP office about a year ago.    Patient also appears to have a chronically low potassium.  Most recent was drawn in September at [facility] and was 2.9.  In looking at his lab history over the past several years, his readings ranged from 2.7-3.2.  He notes that occasionally he will be asked to take a prescription potassium supplement for a month, but never beyond that.    He has now had both Covid vaccinations.   Past Medical History: hyperlipidemia On pravastatin weekly  (9/20:  TC 221, TG 91, HDL 46, LDL 157)  DM2 A1c 5.8 (9/20) only lab available, on metformin, Farxiga  CHF Echo 12/19: EF 55%, severe LVH, grade 1 DD  OSA Uses CPAP daily  TIA Some memory/balance issues at that time, no residual effects, Nov 2019     Blood Pressure Goal:  130/80  Current Medications:  Telmisartan hctz 80/12.5 qd, doxazosin 4 mg qd, carvedilol 25 mg bid,  amlodipine 10 mg qd  Family Hx: paternal headaches with hypertension, maternal obesity with hypertension  Social Hx: no tobacco, occasion social alcohol alcohol - 1 x in last 10 days; black tea 2 cups per day, no soda  Diet: always eats home cooked balanced meals, no starches, keto 99% (ate pasta last night); no fruits other than occasional banana  Exercise:  Gym membership - wants to get back now vaccinated, walks when not too cold outside   Home BP readings: this past week has been as high as 200/100, did not bring any readings with him today  Intolerances: nkda  Labs: 05/2019:  N 146, K 2.9, Glu 110, BUN 25, SCr 1.19 (CrCl with weight at 128.5 kg = 123; with IBW 77.6  Wt Readings from Last 3 Encounters:  10/10/19 283 lb 6.4 oz (128.5 kg)  09/03/19 276 lb 9.6 oz (125.5 kg)  03/26/19 (!) 303 lb 9.6 oz (137.7 kg)   BP Readings from Last 3 Encounters:  11/19/19 (!) 168/88  10/10/19 (!) 158/92  09/03/19 (!) 170/86   Pulse Readings from Last 3 Encounters:  11/19/19 68  10/10/19 97  09/03/19 (!) 54    Current Outpatient Medications  Medication Sig Dispense Refill  . allopurinol (ZYLOPRIM) 300 MG tablet Take 300 mg by mouth daily.    Marland Kitchen  amLODipine (NORVASC) 10 MG tablet Take 1 tablet (10 mg total) by mouth daily. 90 tablet 3  . aspirin 325 MG tablet Take 325 mg by mouth daily.    Marland Kitchen buPROPion (WELLBUTRIN XL) 150 MG 24 hr tablet Take 100 mg by mouth daily.   2  . carvedilol (COREG) 25 MG tablet Take 25 mg by mouth 2 (two) times daily.    . dapagliflozin propanediol (FARXIGA) 10 MG TABS tablet Take 10 mg by mouth daily.    Marland Kitchen doxazosin (CARDURA) 4 MG tablet Take 1 tablet (4 mg total) by mouth daily. 90 tablet 0  . escitalopram (LEXAPRO) 10 MG tablet Take 10 mg by mouth daily.    . metFORMIN (GLUCOPHAGE) 500 MG tablet Take 500 mg by mouth daily with breakfast. 1 tab 2x a day.    . tamsulosin (FLOMAX) 0.4 MG CAPS capsule Take 0.4 mg by mouth daily.  4  . telmisartan-hydrochlorothiazide  (MICARDIS HCT) 80-12.5 MG tablet Take 1 tablet by mouth daily.    . traZODone (DESYREL) 150 MG tablet Take 100 mg by mouth at bedtime.      No current facility-administered medications for this visit.    No Known Allergies  Past Medical History:  Diagnosis Date  . BPH (benign prostatic hyperplasia)   . Depression   . Diabetes mellitus without complication ([place])   . Gout   . Hyperlipidemia   . Hypertension   . Insomnia   . Low testosterone     Blood pressure (!) 168/88, pulse 68.  (standing 162/90) (right arm 172/86)  Essential hypertension Patient with mixed systolic/diastolic hypertension, currently not at goal despite compliance with 5 medications.  Patient has history of low potassium levels, which could potentially be a cause of hypertension.  Will have him get metabolic panel today to see where levels currently are.  Will most likely consider adding potassium supplementation on daily long term basis and see if this will help bring readings back to WNL.  If readings remain high despite supplementation, would consider adding spironolactone at next appointment, for dual purpose of increasing potassium and decreasing blood pressure.  Patient aware that we will call him tomorrow when results are in, to determine dose of potassium needed.     Derek Duarte PharmD CPP [facility] [address] [place], [place] [address] [phone]

## 2019-11-19 NOTE — Telephone Encounter (Signed)
New Message    Pt c/o BP issue: STAT if pt c/o blurred vision, one-sided weakness or slurred speech  1. What are your last 5 BP readings? 178/96 168/92 183/97 188/93 186/92   2. Are you having any other symptoms (ex. Dizziness, headache, blurred vision, passed out)? Headaches and some blurred vision   3. What is your BP issue? Pt says for 5 days he has been experiencing elevated BP and he has increased his amlodipine and saw no changes

## 2019-11-19 NOTE — Telephone Encounter (Signed)
Received call from patient stating he continues to experience elevated BP.   He states he sent a message last week and his amlodipine was increased, has not been effective or caused any decrease in BP.   States he started noticing elevated BP last Thursday, 10 days after receiving the Covid vaccine.  He started feeling fatigued, having intermittent blurry vision, and a constant HA.   This AM BP was 181/97 (after AM medication), yesterday readings: 189/101, 196/97, 201/97, 176/94, HR 50s-60s.   Appt made for today with HTN clinic at 2:30 pm.    Advised if symptoms worsen, proceed to ER for evaluation.   Patient aware and verbalized understanding.

## 2019-11-20 ENCOUNTER — Telehealth: Payer: Self-pay | Admitting: Pharmacist Clinician (PhC)/ Clinical Pharmacy Specialist

## 2019-11-20 ENCOUNTER — Telehealth: Payer: Self-pay

## 2019-11-20 LAB — BASIC METABOLIC PANEL
BUN/Creatinine Ratio: 13 (ref 9–20)
BUN: 22 mg/dL (ref 6–24)
CO2: 28 mmol/L (ref 20–29)
Calcium: 9.2 mg/dL (ref 8.7–10.2)
Chloride: 100 mmol/L (ref 96–106)
Creatinine, Ser: 1.64 mg/dL — ABNORMAL HIGH (ref 0.76–1.27)
GFR calc Af Amer: 53 mL/min/{1.73_m2} — ABNORMAL LOW (ref >59)
GFR calc non Af Amer: 45 mL/min/{1.73_m2} — ABNORMAL LOW (ref >59)
Glucose: 152 mg/dL — ABNORMAL HIGH (ref 65–99)
Potassium: 2.8 mmol/L — CL (ref 3.5–5.2)
Sodium: 145 mmol/L — ABNORMAL HIGH (ref 134–144)

## 2019-11-20 MED ORDER — POTASSIUM CHLORIDE CRYS ER 20 MEQ PO TBCR: 20.0000 meq | EXTENDED_RELEASE_TABLET | Freq: Every day | ORAL | 3 refills | Status: DC

## 2019-11-20 NOTE — Telephone Encounter (Signed)
Spoke with patient - he is to take KCl 20 mEq bid x 2 days then 20 mEq daily thereafter.  If BP not down over next week he should call back.  Also stay hydrated to protect kidneys.  Will repeat BMET at next visit.    Would like to keep him on KCl for several months to see if this helps with BP spikes and brings up K to safer level.

## 2019-11-20 NOTE — Telephone Encounter (Signed)
[facility] called and gave critical potassium of 2.8 - Dr. Martinique already addressed the lab with pharmacy.

## 2019-11-22 ENCOUNTER — Telehealth: Payer: Self-pay | Admitting: Cardiology

## 2019-11-22 ENCOUNTER — Other Ambulatory Visit: Payer: Self-pay

## 2019-11-22 ENCOUNTER — Emergency Department (HOSPITAL_COMMUNITY)
Admission: EM | Admit: 2019-11-22 | Discharge: 2019-11-22 | Disposition: A | Discharge status: Home / Self Care | Payer: 59 | Attending: Emergency Medicine | Admitting: Emergency Medicine

## 2019-11-22 ENCOUNTER — Emergency Department (HOSPITAL_COMMUNITY): Payer: 59

## 2019-11-22 ENCOUNTER — Encounter (HOSPITAL_COMMUNITY): Payer: Self-pay | Admitting: *Deleted

## 2019-11-22 DIAGNOSIS — Z7982 Long term (current) use of aspirin: Secondary | ICD-10-CM | POA: Insufficient documentation

## 2019-11-22 DIAGNOSIS — G510 Bell's palsy: Secondary | ICD-10-CM | POA: Diagnosis not present

## 2019-11-22 DIAGNOSIS — Z79899 Other long term (current) drug therapy: Secondary | ICD-10-CM | POA: Insufficient documentation

## 2019-11-22 DIAGNOSIS — Z7984 Long term (current) use of oral hypoglycemic drugs: Secondary | ICD-10-CM | POA: Insufficient documentation

## 2019-11-22 DIAGNOSIS — R29810 Facial weakness: Secondary | ICD-10-CM

## 2019-11-22 DIAGNOSIS — E876 Hypokalemia: Secondary | ICD-10-CM | POA: Insufficient documentation

## 2019-11-22 DIAGNOSIS — Z20822 Contact with and (suspected) exposure to covid-19: Secondary | ICD-10-CM | POA: Diagnosis not present

## 2019-11-22 DIAGNOSIS — E119 Type 2 diabetes mellitus without complications: Secondary | ICD-10-CM | POA: Diagnosis not present

## 2019-11-22 DIAGNOSIS — I10 Essential (primary) hypertension: Secondary | ICD-10-CM

## 2019-11-22 LAB — I-STAT CHEM 8, ED
BUN: 27 mg/dL — ABNORMAL HIGH (ref 6–20)
Calcium, Ion: 1.12 mmol/L — ABNORMAL LOW (ref 1.15–1.40)
Chloride: 99 mmol/L (ref 98–111)
Creatinine, Ser: 1.3 mg/dL — ABNORMAL HIGH (ref 0.61–1.24)
Glucose, Bld: 196 mg/dL — ABNORMAL HIGH (ref 70–99)
HCT: 48 % (ref 39.0–52.0)
Hemoglobin: 16.3 g/dL (ref 13.0–17.0)
Potassium: 2.4 mmol/L — CL (ref 3.5–5.1)
Sodium: 142 mmol/L (ref 135–145)
TCO2: 29 mmol/L (ref 22–32)

## 2019-11-22 LAB — COMPREHENSIVE METABOLIC PANEL
ALT: 24 U/L (ref 0–44)
AST: 19 U/L (ref 15–41)
Albumin: 3.7 g/dL (ref 3.5–5.0)
Alkaline Phosphatase: 111 U/L (ref 38–126)
Anion gap: 13 (ref 5–15)
BUN: 25 mg/dL — ABNORMAL HIGH (ref 6–20)
CO2: 26 mmol/L (ref 22–32)
Calcium: 8.7 mg/dL — ABNORMAL LOW (ref 8.9–10.3)
Chloride: 101 mmol/L (ref 98–111)
Creatinine, Ser: 1.37 mg/dL — ABNORMAL HIGH (ref 0.61–1.24)
GFR calc Af Amer: >60 mL/min (ref >60)
GFR calc non Af Amer: 56 mL/min — ABNORMAL LOW (ref >60)
Glucose, Bld: 201 mg/dL — ABNORMAL HIGH (ref 70–99)
Potassium: 2.4 mmol/L — CL (ref 3.5–5.1)
Sodium: 140 mmol/L (ref 135–145)
Total Bilirubin: 0.9 mg/dL (ref 0.3–1.2)
Total Protein: 6.6 g/dL (ref 6.5–8.1)

## 2019-11-22 LAB — PROTIME-INR
INR: 0.9 (ref 0.8–1.2)
Prothrombin Time: 12.5 seconds (ref 11.4–15.2)

## 2019-11-22 LAB — URINALYSIS, ROUTINE W REFLEX MICROSCOPIC
Bacteria, UA: NONE SEEN
Bilirubin Urine: NEGATIVE
Glucose, UA: >=500 mg/dL — AB
Hgb urine dipstick: NEGATIVE
Ketones, ur: NEGATIVE mg/dL
Leukocytes,Ua: NEGATIVE
Nitrite: NEGATIVE
Protein, ur: 100 mg/dL — AB
Specific Gravity, Urine: 1.011 (ref 1.005–1.030)
pH: 7 (ref 5.0–8.0)

## 2019-11-22 LAB — RAPID URINE DRUG SCREEN, HOSP PERFORMED
Amphetamines: NOT DETECTED
Barbiturates: NOT DETECTED
Benzodiazepines: NOT DETECTED
Cocaine: NOT DETECTED
Opiates: NOT DETECTED
Tetrahydrocannabinol: NOT DETECTED

## 2019-11-22 LAB — CBC
HCT: 47.9 % (ref 39.0–52.0)
Hemoglobin: 16.4 g/dL (ref 13.0–17.0)
MCH: 28.2 pg (ref 26.0–34.0)
MCHC: 34.2 g/dL (ref 30.0–36.0)
MCV: 82.3 fL (ref 80.0–100.0)
Platelets: 190 10*3/uL (ref 150–400)
RBC: 5.82 MIL/uL — ABNORMAL HIGH (ref 4.22–5.81)
RDW: 13 % (ref 11.5–15.5)
WBC: 7.2 10*3/uL (ref 4.0–10.5)
nRBC: 0 % (ref 0.0–0.2)

## 2019-11-22 LAB — DIFFERENTIAL
Abs Immature Granulocytes: 0.01 10*3/uL (ref 0.00–0.07)
Basophils Absolute: 0 10*3/uL (ref 0.0–0.1)
Basophils Relative: 0 %
Eosinophils Absolute: 0.2 10*3/uL (ref 0.0–0.5)
Eosinophils Relative: 2 %
Immature Granulocytes: 0 %
Lymphocytes Relative: 33 %
Lymphs Abs: 2.3 10*3/uL (ref 0.7–4.0)
Monocytes Absolute: 0.5 10*3/uL (ref 0.1–1.0)
Monocytes Relative: 7 %
Neutro Abs: 4.2 10*3/uL (ref 1.7–7.7)
Neutrophils Relative %: 58 %

## 2019-11-22 LAB — APTT: aPTT: 27 seconds (ref 24–36)

## 2019-11-22 LAB — SARS CORONAVIRUS 2 (TAT 6-24 HRS): SARS Coronavirus 2: NEGATIVE

## 2019-11-22 LAB — ETHANOL: Alcohol, Ethyl (B): <10 mg/dL (ref <10)

## 2019-11-22 MED ORDER — LABETALOL HCL 5 MG/ML IV SOLN
20.0000 mg | Freq: Once | INTRAVENOUS | Status: AC
  Administered 2019-11-22: 20 mg via INTRAVENOUS
  Filled 2019-11-22: qty 4

## 2019-11-22 MED ORDER — POTASSIUM CHLORIDE CRYS ER 20 MEQ PO TBCR: 40.0000 meq | EXTENDED_RELEASE_TABLET | Freq: Every day | ORAL | 3 refills | Status: DC

## 2019-11-22 MED ORDER — PREDNISONE 10 MG PO TABS: 20.0000 mg | ORAL_TABLET | Freq: Every day | ORAL | 0 refills | Status: DC

## 2019-11-22 MED ORDER — GADOBUTROL 1 MMOL/ML IV SOLN
10.0000 mL | Freq: Once | INTRAVENOUS | Status: AC | PRN
  Administered 2019-11-22: 10 mL via INTRAVENOUS

## 2019-11-22 MED ORDER — VALACYCLOVIR HCL 1 G PO TABS: 1000.0000 mg | ORAL_TABLET | Freq: Three times a day (TID) | ORAL | 0 refills | Status: AC

## 2019-11-22 MED ORDER — POTASSIUM CHLORIDE 20 MEQ/15ML (10%) PO SOLN
40.0000 meq | Freq: Once | ORAL | Status: AC
  Administered 2019-11-22: 09:00:00 40 meq via ORAL
  Filled 2019-11-22: qty 30

## 2019-11-22 MED ORDER — POTASSIUM CHLORIDE 10 MEQ/100ML IV SOLN
10.0000 meq | INTRAVENOUS | Status: AC
  Administered 2019-11-22 (×3): 10 meq via INTRAVENOUS
  Filled 2019-11-22 (×3): qty 100

## 2019-11-22 MED ORDER — HYDRALAZINE HCL 20 MG/ML IJ SOLN
10.0000 mg | Freq: Once | INTRAMUSCULAR | Status: AC
  Administered 2019-11-22: 10 mg via INTRAVENOUS
  Filled 2019-11-22: qty 1

## 2019-11-22 NOTE — ED Triage Notes (Signed)
Patient presents to ed c/o continuous occipital headache x 2 week, states he was fine last pm at 8pm was trying to drink some water at 68mn and noticed the left side of his face felt funny. States his blood pressure has been going up and down for 1 week his doctor has been changing up the doses of his medication. Currently alert oriented. Bilateral grips strong and equal. Wife is in the parking lot patient has called and updated her.

## 2019-11-22 NOTE — ED Notes (Signed)
Patient transported to CT 

## 2019-11-22 NOTE — Telephone Encounter (Signed)
Pt calls in reporting continued elevated BPs for over a week now.  Avg greater than 180/90s.  Has had a HA for over a week also.   This morning he felt weakness on right side of his face when drinking.  States "I failed the smile test".  No other complaints/symptoms. Pt advised to go to [facility] ED for evaluation. Pt agreeable to plan.  Notified [employer] cardmaster, Wannetta Sender, at hospital of pts pending arrival.

## 2019-11-22 NOTE — ED Notes (Signed)
C/o left eye not feeling right , denies blurred vision, states it just doesn't feel right.  Dr. Jeanell Sparrow aware.

## 2019-11-22 NOTE — Telephone Encounter (Signed)
New Message   Pt c/o BP issue: STAT if pt c/o blurred vision, one-sided weakness or slurred speech  1. What are your last 5 BP readings? 182/97 hr 63   2. Are you having any other symptoms (ex. Dizziness, headache, blurred vision, passed out)? Headaches since last Thursday and some blurred vision   3. What is your BP issue? Pt states his BP has been elevated since last Thursday. He is experiencing Headache and Blurred Vision. Pt is now feeling the right side of his face in the mouth area is drooping.  Pts wife is wanting to take him to the Hospital and is wondering where they need to go

## 2019-11-22 NOTE — Discharge Instructions (Addendum)
He did not appear to have a stroke on your MRI Your potassium was low and I have increased it to double your prior dose.  Please discuss with your doctor and have rechecked early next week Please follow-up with Dr. Martinique regarding your high blood pressure. You are being treated for Bell's palsy with prednisone and antivirals. Return the emergency department if you have worsening symptoms

## 2019-11-22 NOTE — ED Provider Notes (Signed)
[facility] EMERGENCY DEPARTMENT Provider Note   CSN: [id number] Arrival date & time: 11/22/19  [id number]     History No chief complaint on file.   Derek Duarte is a 59 y.o. male.  HPI 59 year old male history of hypertension, hyperlipidemia, diabetes presents today complaining of right sided facial drooping.  He states last known normal at 8 PM last night when he ate dinner.  Proximately midnight he had something to drink and noticed that his mouth felt funny and he was having difficulty controlling his drinking.  This morning he feels that he is having pulling to the right side of his face.  He has not noted any lateralized weakness in his arms, legs, difficulty speaking, or walking.  He has had some headache.  He states he has had a headache since last week when his blood pressure was out of control.  He saw his primary care doctor, Dr. Harrington Challenger, at [facility] and had his amlodipine increased from 5-10.  He states he is continued to have high blood pressures.    Past Medical History:  Diagnosis Date  . BPH (benign prostatic hyperplasia)   . Depression   . Diabetes mellitus without complication ([place])   . Gout   . Hyperlipidemia   . Hypertension   . Insomnia   . Low testosterone     Patient Active Problem List   Diagnosis Date Noted  . Chest pain 10/19/2018  . Type 2 diabetes mellitus without complication ([place]) [id number]  . Essential hypertension 10/19/2018  . Obesity, Class III, BMI 40-49.9 (morbid obesity) ([place]) 10/19/2018  . History of TIA (transient ischemic attack) 10/19/2018  . Hypokalemia     Past Surgical History:  Procedure Laterality Date  . HERNIA REPAIR         Family History  Problem Relation Age of Onset  . Hypertension Other   . Atrial fibrillation Mother        had ablation    Social History   Tobacco Use  . Smoking status: Never Smoker  . Smokeless tobacco: Never Used  Substance Use Topics  . Alcohol use: Yes    Alcohol/week:  2.0 standard drinks    Types: 2 Cans of beer per week  . Drug use: Not Currently    Home Medications Prior to Admission medications   Medication Sig Start Date End Date Taking? Authorizing Provider  allopurinol (ZYLOPRIM) 300 MG tablet Take 300 mg by mouth daily.    [provider]  amLODipine (NORVASC) 10 MG tablet Take 1 tablet (10 mg total) by mouth daily. 11/15/19 02/13/20  Martinique, Peter M, MD  aspirin 325 MG tablet Take 325 mg by mouth daily.    [provider]  buPROPion (WELLBUTRIN XL) 150 MG 24 hr tablet Take 100 mg by mouth daily.  05/23/18   [provider]  carvedilol (COREG) 25 MG tablet Take 25 mg by mouth 2 (two) times daily. 10/05/19   [provider]  dapagliflozin propanediol (FARXIGA) 10 MG TABS tablet Take 10 mg by mouth daily.    [provider]  doxazosin (CARDURA) 4 MG tablet Take 1 tablet (4 mg total) by mouth daily. 10/10/19   Almyra Deforest, PA  escitalopram (LEXAPRO) 10 MG tablet Take 10 mg by mouth daily. 04/15/16   [provider]  metFORMIN (GLUCOPHAGE) 500 MG tablet Take 500 mg by mouth daily with breakfast. 1 tab 2x a day.    [provider]  potassium chloride SA (KLOR-CON) 20 MEQ tablet  Take 1 tablet (20 mEq total) by mouth daily. 11/20/19   Martinique, Peter M, MD  tamsulosin (FLOMAX) 0.4 MG CAPS capsule Take 0.4 mg by mouth daily. 06/28/18   [provider]  telmisartan-hydrochlorothiazide (MICARDIS HCT) 80-12.5 MG tablet Take 1 tablet by mouth daily. 09/20/18   [provider]  traZODone (DESYREL) 150 MG tablet Take 100 mg by mouth at bedtime.  03/15/16   [provider]    Allergies    Patient has no known allergies.  Review of Systems   Review of Systems  All other systems reviewed and are negative.   Physical Exam Updated Vital Signs There were no vitals taken for this visit.  Physical Exam Vitals and nursing note reviewed.  Constitutional:      General: He is not in  acute distress.    Appearance: Normal appearance. He is obese. He is not ill-appearing.  HENT:     Head: Normocephalic.     Right Ear: External ear normal.     Left Ear: External ear normal.     Nose: Nose normal.     Mouth/Throat:     Mouth: Mucous membranes are moist.  Eyes:     Extraocular Movements: Extraocular movements intact.     Pupils: Pupils are equal, round, and reactive to light.  Cardiovascular:     Rate and Rhythm: Normal rate and regular rhythm.  Pulmonary:     Effort: Pulmonary effort is normal.     Breath sounds: Normal breath sounds.  Abdominal:     General: Abdomen is flat.     Palpations: Abdomen is soft.  Musculoskeletal:        General: Normal range of motion.     Cervical back: Normal range of motion and neck supple.  Skin:    General: Skin is warm and dry.     Capillary Refill: Capillary refill takes less than 2 seconds.  Neurological:     Mental Status: He is alert and oriented to person, place, and time.     Cranial Nerves: Cranial nerve deficit present.     Sensory: No sensory deficit.     Motor: Weakness present.     Coordination: Coordination normal.     Comments: Left facial droop at mouth Decreased eye closing No forehead weakness eom intact No palmar or leg drift noted   Psychiatric:        Mood and Affect: Mood normal.        Behavior: Behavior normal.     ED Results / Procedures / Treatments   Labs (all labs ordered are listed, but only abnormal results are displayed) Labs Reviewed  ETHANOL  PROTIME-INR  APTT  CBC  DIFFERENTIAL  COMPREHENSIVE METABOLIC PANEL  RAPID URINE DRUG SCREEN, HOSP PERFORMED  URINALYSIS, ROUTINE W REFLEX MICROSCOPIC  I-STAT CHEM 8, ED    EKG EKG Interpretation  Date/Time:  Friday November 22 2019 08:53:48 EST Ventricular Rate:  60 PR Interval:    QRS Duration: 130 QT Interval:  483 QTC Calculation: 483 R Axis:   80 Text Interpretation: Sinus rhythm Nonspecific intraventricular conduction  delay Borderline repolarization abnormality No significant change since last tracing Confirmed by Pattricia Boss [phone]) on 11/22/2019 8:58:15 AM   Radiology CT HEAD WO CONTRAST  Result Date: 11/22/2019 CLINICAL DATA:  Occipital headache for 2 weeks. EXAM: CT HEAD WITHOUT CONTRAST TECHNIQUE: Contiguous axial images were obtained from the base of the skull through the vertex without intravenous contrast. COMPARISON:  None. FINDINGS: Brain: The ventricles  are normal in size and configuration. No extra-axial fluid collections are identified. The gray-white differentiation is maintained. No CT findings for acute hemispheric infarction or intracranial hemorrhage. No mass lesions. The brainstem and cerebellum are normal. Vascular: Scattered vascular calcifications, advanced for age. No hyperdense vessels or obvious aneurysm. Skull: No acute skull fracture. No bone lesion. Sinuses/Orbits: The paranasal sinuses and mastoid air cells are clear. The globes are intact. Other: No scalp lesions, laceration or hematoma. IMPRESSION: Normal head CT. Electronically Signed   By: Marijo Sanes M.D.   On: 11/22/2019 10:32   MR Brain W and Wo Contrast  Result Date: 11/22/2019 CLINICAL DATA:  Occipital headache. EXAM: MRI HEAD WITHOUT AND WITH CONTRAST TECHNIQUE: Multiplanar, multiecho pulse sequences of the brain and surrounding structures were obtained without and with intravenous contrast. CONTRAST:  7mL GADAVIST GADOBUTROL 1 MMOL/ML IV SOLN COMPARISON:  Head CT 11/22/2019 and MRI 08/11/2018 FINDINGS: Brain: There is no evidence of acute infarct, intracranial hemorrhage, mass, midline shift, or extra-axial fluid collection. Mild generalized cerebral atrophy is within normal limits for age. The brain is normal in signal. No abnormal enhancement is identified. Vascular: Major intracranial vascular flow voids are preserved. Skull and upper cervical spine: Unremarkable bone marrow signal. Sinuses/Orbits: Unremarkable orbits.  Minimal mucosal thickening in the paranasal sinuses. Clear mastoid air cells. Other: None. IMPRESSION: Negative brain MRI. Electronically Signed   By: Logan Bores M.D.   On: 11/22/2019 14:27    Procedures Procedures (including critical care time)  Medications Ordered in ED Medications - No data to display  ED Course  I have reviewed the triage vital signs and the nursing notes.  Pertinent labs & imaging results that were available during my care of the patient were reviewed by me and considered in my medical decision making (see chart for details).  Clinical Course as of Nov 21 1449  Fri Nov 22, 2019  1152 All results personally reviewed   [DR]  1152 Discussed results with patient.Plan MRIAwaiting neurology consultReexam reveals some ongoing asymmetry but left side of face less droop than previousNo palmar drift   [DR]    Clinical Course User Index [DR] Pattricia Boss, MD   MDM Rules/Calculators/A&P                      1-Left facial droop- left forehead not involved.  Needs stroke eval 2- hypertension- home meds increased-labetalol and hydralazine here  3- hypokalemia-repletion ensuing. No stroke seen on MRI.  Discussed with Dr. Malen Gauze.  Plan steroids and antivirals Patient is followed by Dr. Martinique for his high blood pressure and he will follow with them regarding high blood pressure We will increase potassium and he will need to have this rechecked as an outpatient. Final Clinical Impression(s) / ED Diagnoses Final diagnoses:  Facial droop  Left-sided Bell's palsy  Hypertension, unspecified type  Hypokalemia    Rx / DC Orders ED Discharge Orders    None       Pattricia Boss, MD 11/22/19 1455

## 2019-11-25 ENCOUNTER — Telehealth: Payer: Self-pay

## 2019-11-25 DIAGNOSIS — I10 Essential (primary) hypertension: Secondary | ICD-10-CM

## 2019-11-25 NOTE — Telephone Encounter (Signed)
Pt called in stated that they went to the hospital and that they found out that they have bell's palsy and that they increased the dose of a medication. This was left on our office voicemail so the pt didn't specify which medication but they did say that they preferred to speak to kristin when she is available. I will route to kristin alvstad

## 2019-11-26 MED ORDER — SPIRONOLACTONE 25 MG PO TABS: 25.0000 mg | ORAL_TABLET | Freq: Every day | ORAL | 5 refills | Status: DC

## 2019-11-26 NOTE — Telephone Encounter (Signed)
Spoke with patient.  Still feeling poorly after ED visit and diagnosis of Bell's Palsy.   Potassium was increased to 40 mEq daily, as level was down to 2.4 at ED visit.  He notes diastolic pressure has come down, but systolic still in the 0000000.    Will have him come in the office this morning for renin/adosterone and BMET to rule out hyperaldosteronism and check potassium at the same time.   Regardless of outcome, he will need further BP medication, so we can start him on spironolactone 25 mg.

## 2019-12-03 LAB — ALDOSTERONE + RENIN ACTIVITY W/ RATIO
ALDOS/RENIN RATIO: >71.9 — ABNORMAL HIGH (ref 0.0–30.0)
ALDOSTERONE: 12 ng/dL (ref 0.0–30.0)
Renin: <0.167 ng/mL/hr — ABNORMAL LOW (ref 0.167–5.380)

## 2019-12-03 LAB — BASIC METABOLIC PANEL
BUN/Creatinine Ratio: 15 (ref 9–20)
BUN: 20 mg/dL (ref 6–24)
CO2: 25 mmol/L (ref 20–29)
Calcium: 9 mg/dL (ref 8.7–10.2)
Chloride: 100 mmol/L (ref 96–106)
Creatinine, Ser: 1.31 mg/dL — ABNORMAL HIGH (ref 0.76–1.27)
GFR calc Af Amer: 69 mL/min/{1.73_m2} (ref >59)
GFR calc non Af Amer: 60 mL/min/{1.73_m2} (ref >59)
Glucose: 247 mg/dL — ABNORMAL HIGH (ref 65–99)
Potassium: 3.4 mmol/L — ABNORMAL LOW (ref 3.5–5.2)
Sodium: 142 mmol/L (ref 134–144)

## 2019-12-06 ENCOUNTER — Telehealth: Payer: Self-pay | Admitting: Cardiology

## 2019-12-06 ENCOUNTER — Telehealth: Payer: Self-pay

## 2019-12-06 DIAGNOSIS — E269 Hyperaldosteronism, unspecified: Secondary | ICD-10-CM

## 2019-12-06 NOTE — Telephone Encounter (Signed)
Spoke with patient regarding appointment for CT Abd Adrenal scheduled Friday 12/13/19--Arrival time is 9:15 am 1st floor radiology at [facility]---NPO after midnight---patient states he will get the information from My Chart.

## 2019-12-06 NOTE — Telephone Encounter (Signed)
Order placed for Adrenal CT Scan.

## 2019-12-10 ENCOUNTER — Other Ambulatory Visit: Payer: Self-pay | Admitting: Pharmacist Clinician (PhC)/ Clinical Pharmacy Specialist

## 2019-12-10 DIAGNOSIS — E269 Hyperaldosteronism, unspecified: Secondary | ICD-10-CM

## 2019-12-12 ENCOUNTER — Telehealth: Payer: Self-pay | Admitting: Cardiology

## 2019-12-12 NOTE — Telephone Encounter (Signed)
Spoke to [place] they were calling to find out since patient is scheduled for a adrenal ct 3/19 does he still need to have ct of abd and pelvis.Advised I will send message to [place] for advice.

## 2019-12-12 NOTE — Telephone Encounter (Signed)
New message  [facility] wants to know CT abdomen pelvis w/o contrast if it is still needed for the patient to have this test? Please advise.

## 2019-12-13 ENCOUNTER — Other Ambulatory Visit: Payer: Self-pay

## 2019-12-13 ENCOUNTER — Ambulatory Visit (HOSPITAL_COMMUNITY)
Admission: RE | Admit: 2019-12-13 | Discharge: 2019-12-13 | Disposition: A | Discharge status: Home / Self Care | Payer: 59 | Source: Ambulatory Visit | Attending: Cardiology | Admitting: Cardiology

## 2019-12-13 DIAGNOSIS — E269 Hyperaldosteronism, unspecified: Secondary | ICD-10-CM | POA: Diagnosis not present

## 2019-12-13 NOTE — Telephone Encounter (Signed)
As far as I am concerned he just needs adrenal CT not complete CT of Abd/pelvis  Reginaldo Hazard Martinique MD, [facility]

## 2019-12-16 NOTE — Telephone Encounter (Signed)
Spoke to [place] advice given.

## 2019-12-18 ENCOUNTER — Other Ambulatory Visit: Payer: Self-pay

## 2019-12-18 DIAGNOSIS — E269 Hyperaldosteronism, unspecified: Secondary | ICD-10-CM

## 2019-12-18 NOTE — Progress Notes (Signed)
Adrenal

## 2019-12-19 ENCOUNTER — Ambulatory Visit (INDEPENDENT_AMBULATORY_CARE_PROVIDER_SITE_OTHER): Payer: 59 | Admitting: Pharmacist

## 2019-12-19 ENCOUNTER — Other Ambulatory Visit: Payer: Self-pay

## 2019-12-19 VITALS — BP 162/90 | HR 61 | Resp 14 | Ht 72.0 in | Wt 299.4 lb

## 2019-12-19 DIAGNOSIS — E269 Hyperaldosteronism, unspecified: Secondary | ICD-10-CM | POA: Diagnosis not present

## 2019-12-19 DIAGNOSIS — I10 Essential (primary) hypertension: Secondary | ICD-10-CM

## 2019-12-19 LAB — BASIC METABOLIC PANEL
BUN/Creatinine Ratio: 17 (ref 9–20)
BUN: 24 mg/dL (ref 6–24)
CO2: 23 mmol/L (ref 20–29)
Calcium: 9.2 mg/dL (ref 8.7–10.2)
Chloride: 104 mmol/L (ref 96–106)
Creatinine, Ser: 1.42 mg/dL — ABNORMAL HIGH (ref 0.76–1.27)
GFR calc Af Amer: 62 mL/min/{1.73_m2} (ref >59)
GFR calc non Af Amer: 54 mL/min/{1.73_m2} — ABNORMAL LOW (ref >59)
Glucose: 260 mg/dL — ABNORMAL HIGH (ref 65–99)
Potassium: 4.5 mmol/L (ref 3.5–5.2)
Sodium: 143 mmol/L (ref 134–144)

## 2019-12-19 MED ORDER — SPIRONOLACTONE 50 MG PO TABS: 50.0000 mg | ORAL_TABLET | Freq: Every day | ORAL | 1 refills | Status: DC

## 2019-12-19 MED ORDER — TELMISARTAN-HCTZ 40-12.5 MG PO TABS: 1.0000 | ORAL_TABLET | Freq: Every day | ORAL | 1 refills | Status: DC

## 2019-12-19 NOTE — Progress Notes (Signed)
HPI:  Derek Duarte is a 59 y.o. male patient of Dr Martinique. PMH includes hyperlipidemia, DM-II, HF with preserved ejection fraction at 55%, OSA on CAPAP, and hx of TIA. Patient notes a history of hypertension going back to his 20's.  Patient presets for HTN follow up after recent diagnosis of hyperaldosteronism. Spironolactone 25mg  was initiated on 3/2/201. Noted patient also taking potassium supplement at 40mg  daily.   He denies problems with current therapy, headaches has improved and recent episode of bell's palsy resolved as well. No BMET repeat done since initiating spironolactone and potassium supplement.  Blood Pressure Goal:  130/80  Current Medications: Telmisartan hctz 80/12.5 daily Doxazosin 4 mg daily  Carvedilol 25mg  twice daily Amlodipine 10mg  daily Spironolactone 25mg  daily  Intolerances: nkda  Family Hx: paternal headaches with hypertension, maternal obesity with hypertension  Social Hx: no tobacco, occasion social alcohol alcohol - 1 x in last 10 days; black tea with milk 2 cups per day, no soda  Diet: always eats home cooked balanced meals, no starches, patient plans to resume keto diet this week;  no fruits other than occasional banana  Exercise:  Gym membership - wants to get back now vaccinated, walks when not too cold outside   Home BP readings: no reading provided but patient report BP at home similar to today's reading ([id number] systolic)  Labs: [id number]: Na 142, K 3.4, Glu 247, BUN 20, Scr 1.31 05/2019:  Na 146, K 2.9, Glu 110, BUN 25, SCr 1.19 (CrCl with weight at 128.5 kg = 123; with IBW 77.6)  Wt Readings from Last 3 Encounters:  12/19/19 299 lb 6.4 oz (135.8 kg)  11/22/19 283 lb (128.4 kg)  10/10/19 283 lb 6.4 oz (128.5 kg)   BP Readings from Last 3 Encounters:  12/19/19 (!) 162/90  11/22/19 (!) 178/85  11/19/19 (!) 168/88   Pulse Readings from Last 3 Encounters:  12/19/19 61  11/22/19 (!) 57  11/19/19 68    Current Outpatient  Medications  Medication Sig Dispense Refill  . allopurinol (ZYLOPRIM) 300 MG tablet Take 300 mg by mouth daily.    Marland Kitchen amLODipine (NORVASC) 10 MG tablet Take 1 tablet (10 mg total) by mouth daily. 90 tablet 3  . aspirin 325 MG tablet Take 325 mg by mouth daily.    Marland Kitchen buPROPion (WELLBUTRIN XL) 150 MG 24 hr tablet Take 300 mg by mouth daily.   2  . carvedilol (COREG) 25 MG tablet Take 25 mg by mouth 2 (two) times daily.    . cholecalciferol (VITAMIN D3) 25 MCG (1000 UNIT) tablet Take 1,000 Units by mouth daily.    Marland Kitchen co-enzyme Q-10 30 MG capsule Take 30 mg by mouth daily.    . dapagliflozin propanediol (FARXIGA) 10 MG TABS tablet Take 10 mg by mouth daily.    Marland Kitchen doxazosin (CARDURA) 4 MG tablet Take 1 tablet (4 mg total) by mouth daily. 90 tablet 0  . escitalopram (LEXAPRO) 10 MG tablet Take 10 mg by mouth daily.    . magnesium gluconate (MAGONATE) 500 MG tablet Take 500 mg by mouth daily.    . metFORMIN (GLUCOPHAGE) 500 MG tablet Take 500 mg by mouth 2 (two) times daily with a meal. 1 tab 2x a day.    . Omega-3 Fatty Acids (FISH OIL) 1000 MG CPDR Take 1,000 mg by mouth daily.    . potassium chloride SA (KLOR-CON) 20 MEQ tablet Take 2 tablets (40 mEq total) by mouth daily. 90 tablet 3  .  spironolactone (ALDACTONE) 50 MG tablet Take 1 tablet (50 mg total) by mouth daily. 90 tablet 1  . tamsulosin (FLOMAX) 0.4 MG CAPS capsule Take 0.4 mg by mouth daily.  4  . traZODone (DESYREL) 100 MG tablet Take 100 mg by mouth at bedtime.     . vitamin B-12 (CYANOCOBALAMIN) 1000 MCG tablet Take 1,000 mcg by mouth daily.    Marland Kitchen telmisartan-hydrochlorothiazide (MICARDIS HCT) 40-12.5 MG tablet Take 1 tablet by mouth daily. 30 tablet 1   No current facility-administered medications for this visit.    No Known Allergies  Past Medical History:  Diagnosis Date  . BPH (benign prostatic hyperplasia)   . Depression   . Diabetes mellitus without complication ([place])   . Gout   . Hyperlipidemia   . Hypertension   .  Insomnia   . Low testosterone     Blood pressure (!) 162/90, pulse 61, resp. rate 14, height 6' (1.829 m), weight 299 lb 6.4 oz (135.8 kg), SpO2 95 %.   Essential hypertension Blood pressure improved but remains above goal. Noted new dx hyperaldosteronism on spironolactone and potassium supplement.  Will repeat BMET,  increase spironolactone from 25mg  to 50mg , decrease telmisartan/HCT to 40-12.5mg , and plan to stop potassium supplenment if values WNL.  We discuss need for BMET every 2 weeks until his spironolactone and other BP medication titrated to goal. Also encouraged increase hydration and physical activity.   Nestor Wieneke Rodriguez-Guzman PharmD, BCPS, [place] [id number] 12/24/2019 7:27 AM

## 2019-12-19 NOTE — Patient Instructions (Addendum)
Return for a  follow up appointment in 4 weeks  Check your blood pressure at home daily (if able) and keep record of the readings.  Repeat blood work today 3/25 and in 2 weeks  Take your BP meds as follows: * INCREASE spironolactone to 50mg  daily - wait for blood work results* * Decrease telmisartan to 40/12.5mg daily  Bring all of your meds, your BP cuff and your record of home blood pressures to your next appointment.  Exercise as you're able, try to walk approximately 30 minutes per day.  Keep salt intake to a minimum, especially watch canned and prepared boxed foods.  Eat more fresh fruits and vegetables and fewer canned items.  Avoid eating in fast food restaurants.    HOW TO TAKE YOUR BLOOD PRESSURE: . Rest 5 minutes before taking your blood pressure. .  Don't smoke or drink caffeinated beverages for at least 30 minutes before. . Take your blood pressure before (not after) you eat. . Sit comfortably with your back supported and both feet on the floor (don't cross your legs). . Elevate your arm to heart level on a table or a desk. . Use the proper sized cuff. It should fit smoothly and snugly around your bare upper arm. There should be enough room to slip a fingertip under the cuff. The bottom edge of the cuff should be 1 inch above the crease of the elbow. . Ideally, take 3 measurements at one sitting and record the average.

## 2019-12-24 ENCOUNTER — Encounter: Payer: Self-pay | Admitting: Pharmacist

## 2019-12-24 NOTE — Assessment & Plan Note (Signed)
Blood pressure improved but remains above goal. Noted new dx hyperaldosteronism on spironolactone and potassium supplement.  Will repeat BMET,  increase spironolactone from 25mg  to 50mg , decrease telmisartan/HCT to 40-12.5mg , and plan to stop potassium supplenment if values WNL.  We discuss need for BMET every 2 weeks until his spironolactone and other BP medication titrated to goal. Also encouraged increase hydration and physical activity.

## 2019-12-27 NOTE — Progress Notes (Signed)
Virtual Visit via Video Note   This visit type was conducted due to national recommendations for restrictions regarding the COVID-19 Pandemic (e.g. social distancing) in an effort to limit this patient's exposure and mitigate transmission in our community.  Due to his co-morbid illnesses, this patient is at least at moderate risk for complications without adequate follow up.  This format is felt to be most appropriate for this patient at this time.  All issues noted in this document were discussed and addressed.  A limited physical exam was performed with this format.  Please refer to the patient's chart for his consent to telehealth for [facility].   The patient was identified using 2 identifiers.  Date:  12/30/2019   ID:  Derek Duarte, DOB [birth year], MRN [id number]  Patient Location: Home Provider Location: Home  PCP:  Lawerance Cruel, MD  Cardiologist:  Azuree Minish Martinique, MD Electrophysiologist:  None   Evaluation Performed:  Follow-Up Visit  Chief Complaint:  Resistant HTN  History of Present Illness:    Derek Duarte is a 59 y.o. male with a hx of resistant hypertension, hypokalemia, hyperlipidemia, DM 2, TIA, OSA on CPAP and atypical chest pain.  Last echocardiogram obtained on 09/07/2018 showed EF 55%, severe LVH, grade 1 DD, mild LAE, otherwise no significant valve issue.  Patient was admitted in January 2020 for chest pain and abnormal EKG.  He was ruled out by troponin and EKG.  Lab work was concerning for severe hypokalemia at 2.7.  He had a Zio monitor at [facility] that showed rare PVC and bigeminy, otherwise no significant arrhythmia.  Myoview study in February 2020 showed no ischemia or infarction.  He has difficult to control blood pressure, Cardura 2 mg daily was added to his medical regimen.  He has been followed by our Hypertensive clinic. Found to have persistent hypokalemia. Laboratory evaluation c/w primary hyperaldosteronism. Started on  aldactone and potassium repleted. CT of adrenals showed a 2.5 cm left adrenal nodule felt to be a benign adenoma. Yearly follow up recommended. He is now on aldactone 50 mg daily. Has noted a marked improvement in diastolic readings and some improvement in systolic readings. BP today 152/75. He does note some loss of libido on aldosterone. Thinking of going back on testosterone therapy. No breast enlargement/tenderness. Takes pravastatin only occasionally. Has gained about 25 lbs this winter.   The patient does not have symptoms concerning for COVID-19 infection (fever, chills, cough, or new shortness of breath).    Past Medical History:  Diagnosis Date  . BPH (benign prostatic hyperplasia)   . Depression   . Diabetes mellitus without complication ([place])   . Gout   . Hyperlipidemia   . Hypertension   . Insomnia   . Low testosterone    Past Surgical History:  Procedure Laterality Date  . HERNIA REPAIR       Current Meds  Medication Sig  . allopurinol (ZYLOPRIM) 300 MG tablet Take 300 mg by mouth daily.  Marland Kitchen amLODipine (NORVASC) 10 MG tablet Take 1 tablet (10 mg total) by mouth daily.  Marland Kitchen aspirin 325 MG tablet Take 325 mg by mouth daily.  Marland Kitchen buPROPion (WELLBUTRIN XL) 150 MG 24 hr tablet Take 300 mg by mouth daily.   . carvedilol (COREG) 25 MG tablet Take 25 mg by mouth 2 (two) times daily.  . cholecalciferol (VITAMIN D3) 25 MCG (1000 UNIT) tablet Take 1,000 Units by mouth daily.  Marland Kitchen co-enzyme Q-10 30 MG capsule Take 30 mg  by mouth daily.  . dapagliflozin propanediol (FARXIGA) 10 MG TABS tablet Take 10 mg by mouth daily.  Marland Kitchen doxazosin (CARDURA) 4 MG tablet Take 1 tablet (4 mg total) by mouth daily.  Marland Kitchen escitalopram (LEXAPRO) 10 MG tablet Take 10 mg by mouth daily.  . magnesium gluconate (MAGONATE) 500 MG tablet Take 500 mg by mouth daily.  . metFORMIN (GLUCOPHAGE) 500 MG tablet Take 500 mg by mouth 2 (two) times daily with a meal. 1 tab 2x a day.  . Omega-3 Fatty Acids (FISH OIL) 1000 MG CPDR  Take 1,000 mg by mouth daily.  Marland Kitchen spironolactone (ALDACTONE) 50 MG tablet Take 1 tablet (50 mg total) by mouth daily.  . tamsulosin (FLOMAX) 0.4 MG CAPS capsule Take 0.4 mg by mouth daily.  Marland Kitchen telmisartan-hydrochlorothiazide (MICARDIS HCT) 40-12.5 MG tablet Take 1 tablet by mouth daily.  . traZODone (DESYREL) 100 MG tablet Take 100 mg by mouth at bedtime.   . vitamin B-12 (CYANOCOBALAMIN) 1000 MCG tablet Take 1,000 mcg by mouth daily.     Allergies:   Patient has no known allergies.   Social History   Tobacco Use  . Smoking status: Never Smoker  . Smokeless tobacco: Never Used  Substance Use Topics  . Alcohol use: Yes    Alcohol/week: 2.0 standard drinks    Types: 2 Cans of beer per week  . Drug use: Not Currently     Family Hx: The patient's family history includes Atrial fibrillation in his mother; Hypertension in an other family member.  ROS:   Please see the history of present illness.     All other systems reviewed and are negative.   Prior CV studies:   The following studies were reviewed today:  CLINICAL DATA:  Hyper aldosteronism.  EXAM: CT ABDOMEN WITHOUT CONTRAST  TECHNIQUE: Multidetector CT imaging of the abdomen was performed following the standard protocol without IV contrast.  COMPARISON:  None.  FINDINGS: Lower chest: No acute abnormality.  Hepatobiliary: No focal liver abnormality is seen. No gallstones, gallbladder wall thickening, or biliary dilatation.  Pancreas: Unremarkable. No pancreatic ductal dilatation or surrounding inflammatory changes.  Spleen: Normal in size without focal abnormality.  Adrenals/Urinary Tract: Left adrenal nodule measures 2.5 x 2.3 x 1.2 cm, best seen on coronal image 101/6. This measures low in attenuation measuring 4.8 Hounsfield units likely reflecting underlying adenoma. Normal right adrenal gland.  Stomach/Bowel: Stomach is within normal limits. Appendix appears normal. No evidence of bowel wall  thickening, distention, or inflammatory changes.  Vascular/Lymphatic: Aortic atherosclerosis. No aneurysm. No abdominopelvic adenopathy.  Other: No free fluid or fluid collections.  Musculoskeletal: No acute or significant osseous findings.  IMPRESSION: 1. No acute findings within the abdomen. 2. Left adrenal gland nodule is identified measuring 2.5 cm. This is favored to represent a benign adenoma. Consider follow-up imaging with adrenal protocol MRI or CT in 12 months. This recommendation follows ACR consensus guidelines: Management of Incidental Adrenal Masses: A White Paper of the ACR Incidental Findings Committee. J Am Coll Radiol 2017;14:1038-1044.  Aortic Atherosclerosis (ICD10-I70.0).   Electronically Signed   By: Kerby Moors M.D.   On: 12/13/2019 11:10  Myoview 11/15/18: Study Highlights    Blood pressure demonstrated a hypertensive response to exercise.  Nuclear stress EF: 44%.  There was no ST segment deviation noted during stress.  The left ventricular ejection fraction is moderately decreased (30-44%).  This is an intermediate risk study.   Normal resting and stress perfusion. No ischemia or infarction EF EF estimated at  44% with LVE and diffuse hypokinesis suggest TTE/MRI correlation    Echo 09/07/18: Study Conclusions   - Left ventricle: The cavity size was normal. Wall thickness was  increased in a pattern of severe LVH. Systolic function was  normal. The estimated ejection fraction was in the range of 55%  to 60%. Wall motion was normal; there were no regional wall  motion abnormalities. Doppler parameters are consistent with  abnormal left ventricular relaxation (grade 1 diastolic  dysfunction).  - Aortic valve: Trileaflet; mildly thickened, mildly calcified  leaflets.  - Left atrium: The atrium was mildly dilated.  - Right ventricle: The cavity size was mildly dilated. Wall  thickness was normal.   Impressions:    - No cardiac source of emboli was indentified.  Labs/Other Tests and Data Reviewed:    EKG:  No ECG reviewed.  Recent Labs: 03/14/2019: TSH 1.890 11/22/2019: ALT 24; Hemoglobin 16.3; Platelets 190 12/19/2019: BUN 24; Creatinine, Ser 1.42; Potassium 4.5; Sodium 143   Recent Lipid Panel Lab Results  Component Value Date/Time   CHOL 182 03/14/2019 09:16 AM   TRIG 104 03/14/2019 09:16 AM   HDL 49 03/14/2019 09:16 AM   CHOLHDL 3.7 03/14/2019 09:16 AM   LDLCALC 112 (H) 03/14/2019 09:16 AM   Dated 06/17/19: cholesterol 221, triglycerides 91, HDL 46, LDL 157.   Wt Readings from Last 3 Encounters:  12/30/19 294 lb (133.4 kg)  12/19/19 299 lb 6.4 oz (135.8 kg)  11/22/19 283 lb (128.4 kg)     Objective:    Vital Signs:  Ht 6' (1.829 m)   Wt 294 lb (133.4 kg)   BMI 39.87 kg/m    VITAL SIGNS:  reviewed  General: WDWM in NAD HEENT normal Respirations normal.  Skin dry.  Neuro: alert and oriented x 3.   ASSESSMENT & PLAN:    1. Hypertension: secondary to hyperaldosteronism. Chronic hypokalemia. Now on aldosterone with improvement. Has follow up in HTN clinic in one week. Will continue to push dose as tolerated. Follow up potassium closely.   2. Hyperlipidemia: Cholesterol is uncontrolled. Recommend more consistent statin use. He wants to try and lose weight.   3. DM2: Managed by primary care provider  4. Obstructive sleep apnea: Continue CPAP therapy.  5. Obesity  COVID-19 Education: The signs and symptoms of COVID-19 were discussed with the patient and how to seek care for testing (follow up with PCP or arrange E-visit).  The importance of social distancing was discussed today.  Time:   Today, I have spent 10 minutes with the patient with telehealth technology discussing the above problems.     Medication Adjustments/Labs and Tests Ordered: Current medicines are reviewed at length with the patient today.  Concerns regarding medicines are outlined above.   Tests  Ordered: No orders of the defined types were placed in this encounter.   Medication Changes: No orders of the defined types were placed in this encounter.   Follow Up:  In Person in 6 month(s) with APP or me  Signed, Brylinn Teaney Martinique, MD  12/30/2019 9:14 AM    [facility]

## 2019-12-30 ENCOUNTER — Telehealth (INDEPENDENT_AMBULATORY_CARE_PROVIDER_SITE_OTHER): Payer: 59 | Admitting: Cardiology

## 2019-12-30 ENCOUNTER — Encounter: Payer: Self-pay | Admitting: Cardiology

## 2019-12-30 VITALS — Ht 72.0 in | Wt 294.0 lb

## 2019-12-30 DIAGNOSIS — E269 Hyperaldosteronism, unspecified: Secondary | ICD-10-CM

## 2019-12-30 DIAGNOSIS — Z7189 Other specified counseling: Secondary | ICD-10-CM

## 2019-12-30 DIAGNOSIS — I152 Hypertension secondary to endocrine disorders: Secondary | ICD-10-CM | POA: Diagnosis not present

## 2019-12-30 DIAGNOSIS — E785 Hyperlipidemia, unspecified: Secondary | ICD-10-CM | POA: Diagnosis not present

## 2019-12-30 DIAGNOSIS — G4733 Obstructive sleep apnea (adult) (pediatric): Secondary | ICD-10-CM

## 2019-12-30 DIAGNOSIS — Z9989 Dependence on other enabling machines and devices: Secondary | ICD-10-CM

## 2019-12-30 MED ORDER — ASPIRIN EC 81 MG PO TBEC: 81.0000 mg | DELAYED_RELEASE_TABLET | Freq: Every day | ORAL | 3 refills | Status: DC

## 2019-12-30 NOTE — Patient Instructions (Signed)
Medication Instructions:  Continue same medications *If you need a refill on your cardiac medications before your next appointment, please call your pharmacy*   Lab Work: None ordered    Testing/Procedures: None ordered   Follow-Up: At [facility], you and your health needs are our priority.  As part of our continuing mission to provide you with exceptional heart care, we have created designated Provider Care Teams.  These Care Teams include your primary Cardiologist (physician) and Advanced Practice Providers (APPs -  Physician Assistants and Nurse Practitioners) who all work together to provide you with the care you need, when you need it.  We recommend signing up for the patient portal called "MyChart".  Sign up information is provided on this After Visit Summary.  MyChart is used to connect with patients for Virtual Visits (Telemedicine).  Patients are able to view lab/test results, encounter notes, upcoming appointments, etc.  Non-urgent messages can be sent to your provider as well.   To learn more about what you can do with MyChart, go to [email or web address].    Your next appointment:  6 months    Call in July to schedule Oct appointment    The format for your next appointment: Office     Provider: Dr.Jordan     Keep appointment with Hypertension Clinic   Thurs 4/22 at 11:00 am

## 2020-01-06 ENCOUNTER — Other Ambulatory Visit: Payer: Self-pay | Admitting: Physician Assistant

## 2020-01-06 NOTE — Telephone Encounter (Signed)
Rx request sent to pharmacy.  

## 2020-01-10 ENCOUNTER — Other Ambulatory Visit: Payer: Self-pay | Admitting: Cardiology

## 2020-01-15 ENCOUNTER — Other Ambulatory Visit: Payer: Self-pay

## 2020-01-15 DIAGNOSIS — I10 Essential (primary) hypertension: Secondary | ICD-10-CM

## 2020-01-15 DIAGNOSIS — E269 Hyperaldosteronism, unspecified: Secondary | ICD-10-CM

## 2020-01-16 ENCOUNTER — Other Ambulatory Visit: Payer: Self-pay

## 2020-01-16 ENCOUNTER — Ambulatory Visit (INDEPENDENT_AMBULATORY_CARE_PROVIDER_SITE_OTHER): Payer: 59 | Admitting: Pharmacist Clinician (PhC)/ Clinical Pharmacy Specialist

## 2020-01-16 VITALS — BP 130/76 | HR 54 | Temp 98.0°F | Resp 15 | Ht 72.0 in | Wt 303.4 lb

## 2020-01-16 DIAGNOSIS — E269 Hyperaldosteronism, unspecified: Secondary | ICD-10-CM | POA: Diagnosis not present

## 2020-01-16 LAB — BASIC METABOLIC PANEL
BUN/Creatinine Ratio: 21 — ABNORMAL HIGH (ref 9–20)
BUN: 34 mg/dL — ABNORMAL HIGH (ref 6–24)
CO2: 24 mmol/L (ref 20–29)
Calcium: 9.8 mg/dL (ref 8.7–10.2)
Chloride: 102 mmol/L (ref 96–106)
Creatinine, Ser: 1.6 mg/dL — ABNORMAL HIGH (ref 0.76–1.27)
GFR calc Af Amer: 54 mL/min/{1.73_m2} — ABNORMAL LOW (ref >59)
GFR calc non Af Amer: 47 mL/min/{1.73_m2} — ABNORMAL LOW (ref >59)
Glucose: 217 mg/dL — ABNORMAL HIGH (ref 65–99)
Potassium: 4.4 mmol/L (ref 3.5–5.2)
Sodium: 141 mmol/L (ref 134–144)

## 2020-01-16 MED ORDER — TELMISARTAN-HCTZ 40-12.5 MG PO TABS: 1.0000 | ORAL_TABLET | Freq: Every day | ORAL | 3 refills | Status: DC

## 2020-01-16 NOTE — Assessment & Plan Note (Signed)
Patient with hyperaldosteronism, most recent potassium level at 4.4 on spironolactone 50 mg daily.  He reports his blood pressure is doing much better, mostly 130/70 range at home as well.  Reviewed his lab work from yesterday.  Serum creatinine up 12% to 1.6.  Will continue to monitor that.  Regarding is concerns about libido, he admits to having low testosterone in the past and is willing to give the medication another month before we make any changes.  Could consider switching him to eplerenone should this continue to be a problem for him.  We will have him repeat his labs in another 6 weeks and see him a day or two later.  He will continue to work on dietary concerns, getting back to his keto diet and getting better control of blood sugars.

## 2020-01-16 NOTE — Progress Notes (Signed)
HPI:  Derek Duarte is a 59 y.o. male patient of Derek Duarte. PMH includes hyperlipidemia, DM-II, HF with preserved ejection fraction at 55%, OSA on CAPAP, and hx of TIA. Patient notes a history of hypertension going back to his 20's.  Patient presets for HTN follow up after recent diagnosis of hyperaldosteronism. Spironolactone 25mg  was initiated on 3/2/201. Noted patient also taking potassium supplement at 40mg  daily.  Over the past couple of months we have cut out potassium supplementation and increased spironolactone to 50 mg daily.    Today he returns for follow up.  Labs yesterday indicate potassium level up to 4.4 and serum creatinine increased slightly to 1.6.  He is feeling well overall, although he has 2 main concerns today.  First is that his libido has decreased over the past month and second is that he feels like his appetite is out of control.  Both of these are noteworthy just in the past month or two.    Blood Pressure Goal:  130/80  Current Medications: Telmisartan hctz 80/12.5 mg daily Doxazosin 4 mg daily  Carvedilol 25 mg twice daily Amlodipine 10 mg daily Spironolactone 50 mg daily  Intolerances: nkda  Family Hx: paternal headaches with hypertension, maternal obesity with hypertension  Social Hx: no tobacco, occasion social alcohol alcohol - 1 x in last 10 days; black tea with milk 2 cups per day, no soda  Diet: always eats home cooked balanced meals, no starches (eats cauliflower rice or mashed cauliflower), patient plans to resume keto diet this week;  Gave up banana.   Exercise:  Gym membership - wants to get back now vaccinated, walks when not too cold outside   Home BP readings: no reading provided but patient report BP at home similar to today's reading ([id number] systolic)  Labs: 0000000: Naa 141, K 4.4, Glu 217, BUN 34, SCr 1.6 11/26/2019: Na 142, K 3.4, Glu 247, BUN 20, Scr 1.31 05/2019:  Na 146, K 2.9, Glu 110, BUN 25, SCr 1.19 (CrCl with weight at 128.5  kg = 123; with IBW 77.6)  Wt Readings from Last 3 Encounters:  01/16/20 (!) 303 lb 6.4 oz (137.6 kg)  12/30/19 294 lb (133.4 kg)  12/19/19 299 lb 6.4 oz (135.8 kg)   BP Readings from Last 3 Encounters:  01/16/20 130/76  12/19/19 (!) 162/90  11/22/19 (!) 178/85   Pulse Readings from Last 3 Encounters:  01/16/20 (!) 54  12/19/19 61  11/22/19 (!) 57    Current Outpatient Medications  Medication Sig Dispense Refill  . allopurinol (ZYLOPRIM) 300 MG tablet Take 300 mg by mouth daily.    Marland Kitchen amLODipine (NORVASC) 10 MG tablet Take 1 tablet (10 mg total) by mouth daily. 90 tablet 3  . aspirin EC 81 MG tablet Take 1 tablet (81 mg total) by mouth daily. 90 tablet 3  . buPROPion (WELLBUTRIN XL) 150 MG 24 hr tablet Take 300 mg by mouth daily.   2  . carvedilol (COREG) 25 MG tablet Take 25 mg by mouth 2 (two) times daily.    . cholecalciferol (VITAMIN D3) 25 MCG (1000 UNIT) tablet Take 1,000 Units by mouth daily.    Marland Kitchen co-enzyme Q-10 30 MG capsule Take 30 mg by mouth daily.    . dapagliflozin propanediol (FARXIGA) 10 MG TABS tablet Take 10 mg by mouth daily.    Marland Kitchen doxazosin (CARDURA) 4 MG tablet TAKE 1 TABLET BY MOUTH EVERY DAY 90 tablet 1  . escitalopram (LEXAPRO) 10 MG tablet  Take 10 mg by mouth daily.    . magnesium gluconate (MAGONATE) 500 MG tablet Take 500 mg by mouth daily.    . metFORMIN (GLUCOPHAGE) 500 MG tablet Take 500 mg by mouth 2 (two) times daily with a meal. 1 tab 2x a day.    . Omega-3 Fatty Acids (FISH OIL) 1000 MG CPDR Take 1,000 mg by mouth daily.    Marland Kitchen spironolactone (ALDACTONE) 50 MG tablet Take 1 tablet (50 mg total) by mouth daily. 90 tablet 1  . tamsulosin (FLOMAX) 0.4 MG CAPS capsule Take 0.4 mg by mouth daily.  4  . telmisartan-hydrochlorothiazide (MICARDIS HCT) 40-12.5 MG tablet Take 1 tablet by mouth daily. 90 tablet 3  . traZODone (DESYREL) 100 MG tablet Take 100 mg by mouth at bedtime.     . vitamin B-12 (CYANOCOBALAMIN) 1000 MCG tablet Take 1,000 mcg by mouth daily.      No current facility-administered medications for this visit.    No Known Allergies  Past Medical History:  Diagnosis Date  . BPH (benign prostatic hyperplasia)   . Depression   . Diabetes mellitus without complication ([place])   . Gout   . Hyperlipidemia   . Hypertension   . Insomnia   . Low testosterone     Blood pressure 130/76, pulse (!) 54, temperature 98 F (36.7 C), resp. rate 15, height 6' (1.829 m), weight (!) 303 lb 6.4 oz (137.6 kg), SpO2 94 %.   Hyperaldosteronism ([place]) Patient with hyperaldosteronism, most recent potassium level at 4.4 on spironolactone 50 mg daily.  He reports his blood pressure is doing much better, mostly 130/70 range at home as well.  Reviewed his lab work from yesterday.  Serum creatinine up 12% to 1.6.  Will continue to monitor that.  Regarding is concerns about libido, he admits to having low testosterone in the past and is willing to give the medication another month before we make any changes.  Could consider switching him to eplerenone should this continue to be a problem for him.  We will have him repeat his labs in another 6 weeks and see him a day or two later.  He will continue to work on dietary concerns, getting back to his keto diet and getting better control of blood sugars.     [name] PharmD CPP [facility] [address] [place] [address] 01/16/2020 11:50 AM

## 2020-01-16 NOTE — Patient Instructions (Signed)
Return for a a follow up appointment June 3  Go to the lab 1-2 days prior to your appointment  Your blood pressure today is 130/76   Check your blood pressure at home daily (if able) and keep record of the readings.  Take your BP meds as follows:  Continue with your current medications  Bring all of your meds, your BP cuff and your record of home blood pressures to your next appointment.  Exercise as you're able, try to walk approximately 30 minutes per day.  Keep salt intake to a minimum, especially watch canned and prepared boxed foods.  Eat more fresh fruits and vegetables and fewer canned items.  Avoid eating in fast food restaurants.    HOW TO TAKE YOUR BLOOD PRESSURE: . Rest 5 minutes before taking your blood pressure. .  Don't smoke or drink caffeinated beverages for at least 30 minutes before. . Take your blood pressure before (not after) you eat. . Sit comfortably with your back supported and both feet on the floor (don't cross your legs). . Elevate your arm to heart level on a table or a desk. . Use the proper sized cuff. It should fit smoothly and snugly around your bare upper arm. There should be enough room to slip a fingertip under the cuff. The bottom edge of the cuff should be 1 inch above the crease of the elbow. . Ideally, take 3 measurements at one sitting and record the average.

## 2020-02-26 ENCOUNTER — Other Ambulatory Visit: Payer: Self-pay

## 2020-02-26 DIAGNOSIS — I10 Essential (primary) hypertension: Secondary | ICD-10-CM

## 2020-02-26 DIAGNOSIS — E269 Hyperaldosteronism, unspecified: Secondary | ICD-10-CM

## 2020-02-27 ENCOUNTER — Other Ambulatory Visit: Payer: Self-pay

## 2020-02-27 ENCOUNTER — Ambulatory Visit (INDEPENDENT_AMBULATORY_CARE_PROVIDER_SITE_OTHER): Payer: 59 | Admitting: Pharmacist

## 2020-02-27 VITALS — BP 140/78 | HR 72 | Temp 97.6°F | Resp 14 | Ht 72.0 in | Wt 307.8 lb

## 2020-02-27 DIAGNOSIS — I10 Essential (primary) hypertension: Secondary | ICD-10-CM

## 2020-02-27 LAB — BASIC METABOLIC PANEL
BUN/Creatinine Ratio: 19 (ref 9–20)
BUN: 31 mg/dL — ABNORMAL HIGH (ref 6–24)
CO2: 23 mmol/L (ref 20–29)
Calcium: 9.4 mg/dL (ref 8.7–10.2)
Chloride: 107 mmol/L — ABNORMAL HIGH (ref 96–106)
Creatinine, Ser: 1.6 mg/dL — ABNORMAL HIGH (ref 0.76–1.27)
GFR calc Af Amer: 54 mL/min/{1.73_m2} — ABNORMAL LOW (ref >59)
GFR calc non Af Amer: 47 mL/min/{1.73_m2} — ABNORMAL LOW (ref >59)
Glucose: 196 mg/dL — ABNORMAL HIGH (ref 65–99)
Potassium: 4.8 mmol/L (ref 3.5–5.2)
Sodium: 145 mmol/L — ABNORMAL HIGH (ref 134–144)

## 2020-02-27 NOTE — Progress Notes (Signed)
HPI:  Derek Duarte is a 59 y.o. male patient of Dr Martinique. PMH includes hyperlipidemia, DM-II, HF with preserved ejection fraction at 55%, OSA on CAPAP, and hx of TIA. Patient notes a history of hypertension going back to his 20's.  Patient presets for HTN follow up after recent diagnosis of hyperaldosteronism. Spironolactone 25mg  was initiated on 3/2/201. Noted patient also taking potassium supplement at 40mg  daily.  Over the past couple of months we have cut out potassium supplementation, increased spironolactone to 50 mg daily, and decreased telmisartan to 40/12.5mg  daily.  Today he returns for follow up.  Labs yesterday indicate potassium level up to 4.8 and serum creatinine stable at 1.6.  He is feeling well overall, and reports no concerns today. Noted 20 pound weight gain since January , but patient denies SOB, swelling of hand of feet, or increased fatigue. He noticed increased appetite but refused referral to nutritionist or weight management clinic.   Blood Pressure Goal:  130/80  Current Medications: Telmisartan hctz 40/12.5 mg daily Doxazosin 4 mg daily  Carvedilol 25 mg twice daily Amlodipine 10 mg daily Spironolactone 50 mg daily  Intolerances: nkda  Family Hx: paternal headaches with hypertension, maternal obesity with hypertension  Social Hx: no tobacco, occasion social alcohol alcohol - 1 x in last 10 days; black tea with milk 2 cups per day, no soda  Diet: always eats home cooked balanced meals, no starches (eats cauliflower rice or mashed cauliflower), patient plans to resume keto diet this week;  Gave up bananas.    Exercise:  Gym membership - wants to get back now vaccinated, walks when not too cold outside   Home BP readings: no reading provided but patient report BP at home similar to today's reading ([id number] systolic)  Labs: 0000000: Naa 141, K 4.4, Glu 217, BUN 34, SCr 1.6 11/26/2019: Na 142, K 3.4, Glu 247, BUN 20, Scr 1.31  Wt Readings from Last 3  Encounters:  02/27/20 (!) 307 lb 12.8 oz (139.6 kg)  01/16/20 (!) 303 lb 6.4 oz (137.6 kg)  12/30/19 294 lb (133.4 kg)   BP Readings from Last 3 Encounters:  02/27/20 140/78  01/16/20 130/76  12/19/19 (!) 162/90   Pulse Readings from Last 3 Encounters:  02/27/20 72  01/16/20 (!) 54  12/19/19 61    Current Outpatient Medications  Medication Sig Dispense Refill  . allopurinol (ZYLOPRIM) 300 MG tablet Take 300 mg by mouth daily.    Marland Kitchen amLODipine (NORVASC) 10 MG tablet Take 1 tablet (10 mg total) by mouth daily. 90 tablet 3  . aspirin EC 81 MG tablet Take 1 tablet (81 mg total) by mouth daily. 90 tablet 3  . buPROPion (WELLBUTRIN XL) 150 MG 24 hr tablet Take 300 mg by mouth daily.   2  . carvedilol (COREG) 25 MG tablet Take 25 mg by mouth 2 (two) times daily.    . cholecalciferol (VITAMIN D3) 25 MCG (1000 UNIT) tablet Take 1,000 Units by mouth daily.    Marland Kitchen co-enzyme Q-10 30 MG capsule Take 30 mg by mouth daily.    . dapagliflozin propanediol (FARXIGA) 10 MG TABS tablet Take 10 mg by mouth daily.    Marland Kitchen doxazosin (CARDURA) 4 MG tablet TAKE 1 TABLET BY MOUTH EVERY DAY 90 tablet 1  . escitalopram (LEXAPRO) 10 MG tablet Take 10 mg by mouth daily.    . magnesium gluconate (MAGONATE) 500 MG tablet Take 500 mg by mouth daily.    . metFORMIN (GLUCOPHAGE) 500  MG tablet Take 500 mg by mouth 2 (two) times daily with a meal. 1 tab 2x a day.    . Omega-3 Fatty Acids (FISH OIL) 1000 MG CPDR Take 1,000 mg by mouth daily.    Marland Kitchen spironolactone (ALDACTONE) 50 MG tablet Take 1 tablet (50 mg total) by mouth daily. 90 tablet 1  . tamsulosin (FLOMAX) 0.4 MG CAPS capsule Take 0.4 mg by mouth daily.  4  . telmisartan-hydrochlorothiazide (MICARDIS HCT) 40-12.5 MG tablet Take 1 tablet by mouth daily. 90 tablet 3  . traZODone (DESYREL) 100 MG tablet Take 100 mg by mouth at bedtime.     . vitamin B-12 (CYANOCOBALAMIN) 1000 MCG tablet Take 1,000 mcg by mouth daily.     No current facility-administered medications for  this visit.    No Known Allergies  Past Medical History:  Diagnosis Date  . BPH (benign prostatic hyperplasia)   . Depression   . Diabetes mellitus without complication ([place])   . Gout   . Hyperlipidemia   . Hypertension   . Insomnia   . Low testosterone     Blood pressure 140/78, pulse 72, temperature 97.6 F (36.4 C), resp. rate 14, height 6' (1.829 m), weight (!) 307 lb 12.8 oz (139.6 kg), SpO2 98 %.   Essential hypertension Blood pressure is slightly above goal today, but also noted 20 pound weight gained in the last 6 months. Patient denies edema, SOB, or any type of fluid retention. He reports increased appetite and not following his low carb diet. Refused referral to nutritionist or weight management program. Noted potassium level remains at goal and Scr stable at 1.6.  Will continue current therapy without changes. Patient will work on positive lifestyle modification like increased physical activity and weight loss. Losing 5% of his current body weight (~15lbs) can decrease his BP by 5-20mmHg. Patient will work on [employer] and follow up with PCP and Dr Martinique as previously scheduled. I also encouraged patient to call us if BP continue to go up of if problems with current medication.   Tailer Volkert Rodriguez-Guzman PharmD, BCPS, [place] [address] 03/02/2020 8:54 AM

## 2020-02-27 NOTE — Patient Instructions (Addendum)
Return for a  follow up appointment  AS NEEDED  Check your blood pressure at home daily (if able) and keep record of the readings.  Take your BP meds as follows: * NO MEDICATION CHANGES TODAY*   Bring all of your meds, your BP cuff and your record of home blood pressures to your next appointment.  Exercise as you're able, try to walk approximately 30 minutes per day.  Keep salt intake to a minimum, especially watch canned and prepared boxed foods.  Eat more fresh fruits and vegetables and fewer canned items.  Avoid eating in fast food restaurants.    HOW TO TAKE YOUR BLOOD PRESSURE: . Rest 5 minutes before taking your blood pressure. .  Don't smoke or drink caffeinated beverages for at least 30 minutes before. . Take your blood pressure before (not after) you eat. . Sit comfortably with your back supported and both feet on the floor (don't cross your legs). . Elevate your arm to heart level on a table or a desk. . Use the proper sized cuff. It should fit smoothly and snugly around your bare upper arm. There should be enough room to slip a fingertip under the cuff. The bottom edge of the cuff should be 1 inch above the crease of the elbow. . Ideally, take 3 measurements at one sitting and record the average.

## 2020-03-02 ENCOUNTER — Encounter: Payer: Self-pay | Admitting: Pharmacist

## 2020-03-02 NOTE — Assessment & Plan Note (Signed)
Blood pressure is slightly above goal today, but also noted 20 pound weight gained in the last 6 months. Patient denies edema, SOB, or any type of fluid retention. He reports increased appetite and not following his low carb diet. Refused referral to nutritionist or weight management program. Noted potassium level remains at goal and Scr stable at 1.6.  Will continue current therapy without changes. Patient will work on positive lifestyle modification like increased physical activity and weight loss. Losing 5% of his current body weight (~15lbs) can decrease his BP by 5-45mmHg. Patient will work on [employer] and follow up with PCP and Dr Martinique as previously scheduled. I also encouraged patient to call us if BP continue to go up of if problems with current medication.

## 2020-06-12 ENCOUNTER — Other Ambulatory Visit: Payer: Self-pay | Admitting: Cardiology

## 2020-06-17 ENCOUNTER — Other Ambulatory Visit: Payer: Self-pay | Admitting: Cardiology

## 2020-07-09 NOTE — Progress Notes (Signed)
Cardiology Office Note   Date:  07/15/2020   ID:  Derek Duarte, DOB 1961/05/15, MRN [id number]  PCP:  Lawerance Cruel, MD  Cardiologist:   Epifanio Labrador Martinique, MD   Chief Complaint  Patient presents with   Hypertension   Hyperlipidemia      History of Present Illness: Derek Duarte is a 58 y.o. male who is seen for follow up HTN and primary hyperaldosteronism. He has a hx of resistant hypertension, hypokalemia, hyperlipidemia, DM 2, TIA, OSA on CPAPand atypical chest pain. Last echocardiogram obtained on 09/07/2018 showed EF 55%, severe LVH, grade 1 DD, mild LAE, otherwise no significant valve issue. Patient was admitted in January 2020 for chest pain and abnormal EKG. He was ruled out by troponin and EKG. Lab work was concerning for severe hypokalemia at 2.7. He had a Ziomonitor at [facility] that showed rare PVC and bigeminy, otherwise no significant arrhythmia. Myoview study in February 2020 showed no ischemia or infarction. He has difficult to control blood pressure,   He has been followed by our Hypertensive clinic. With  persistent hypokalemia he had  Laboratory evaluation c/w primary hyperaldosteronism. Started on aldactone and potassium repleted. CT of adrenals showed a 2.5 cm left adrenal nodule felt to be a benign adenoma. Yearly follow up recommended.   He is now on aldactone 50 mg daily. Has noted a marked improvement in BP readings and feels much better. Notes BP at home typically 242-683 systolic with diastolics in the 41D.  He recently lost his job so has been under a lot of stress. Had gained a lot of weight. Over the past 12 days is back on a keto diet. Notes walking limited by foot pain which has improved on the keto diet. Takes pravastatin only once a week. Unable to tolerate higher doses due to myalgias.     Past Medical History:  Diagnosis Date   BPH (benign prostatic hyperplasia)    Depression    Diabetes mellitus without  complication ([place])    Gout    Hyperlipidemia    Hypertension    Insomnia    Low testosterone     Past Surgical History:  Procedure Laterality Date   HERNIA REPAIR       Current Outpatient Medications  Medication Sig Dispense Refill   allopurinol (ZYLOPRIM) 300 MG tablet Take 300 mg by mouth daily.     amLODipine (NORVASC) 5 MG tablet Take 5 mg by mouth daily.     buPROPion (WELLBUTRIN XL) 150 MG 24 hr tablet Take 450 mg by mouth daily. Patient takes 3 tablets daily  2   carvedilol (COREG) 25 MG tablet Take 25 mg by mouth 2 (two) times daily.     cholecalciferol (VITAMIN D3) 25 MCG (1000 UNIT) tablet Take 1,000 Units by mouth daily.     co-enzyme Q-10 30 MG capsule Take 30 mg by mouth daily.     dapagliflozin propanediol (FARXIGA) 10 MG TABS tablet Take 10 mg by mouth daily.     doxazosin (CARDURA) 4 MG tablet TAKE 1 TABLET BY MOUTH EVERY DAY 90 tablet 1   escitalopram (LEXAPRO) 10 MG tablet Take 10 mg by mouth daily.     magnesium gluconate (MAGONATE) 500 MG tablet Take 500 mg by mouth daily.     metFORMIN (GLUCOPHAGE) 500 MG tablet Take 500 mg by mouth 2 (two) times daily with a meal. 1 tab 2x a day.     pravastatin (PRAVACHOL) 20 MG tablet Take by  mouth.     spironolactone (ALDACTONE) 50 MG tablet TAKE 1 TABLET BY MOUTH EVERY DAY 90 tablet 0   tamsulosin (FLOMAX) 0.4 MG CAPS capsule Take 0.4 mg by mouth daily.  4   telmisartan-hydrochlorothiazide (MICARDIS HCT) 40-12.5 MG tablet Take 1 tablet by mouth daily. 90 tablet 3   traZODone (DESYREL) 100 MG tablet Take 100 mg by mouth at bedtime.      vitamin B-12 (CYANOCOBALAMIN) 1000 MCG tablet Take 1,000 mcg by mouth daily.     Omega-3 Fatty Acids (FISH OIL) 1000 MG CPDR Take 1,000 mg by mouth daily. (Patient not taking: Reported on 07/15/2020)     No current facility-administered medications for this visit.    Allergies:   Patient has no known allergies.    Social History:  The patient  reports that he  has never smoked. He has never used smokeless tobacco. He reports current alcohol use of about 2.0 standard drinks of alcohol per week. He reports previous drug use.   Family History:  The patient's family history includes Atrial fibrillation in his mother; Hypertension in an other family member.    ROS:  Please see the history of present illness.   Otherwise, review of systems are positive for none.   All other systems are reviewed and negative.    PHYSICAL EXAM: VS:  BP (!) 160/86    Pulse (!) 51    Ht 6' (1.829 m)    Wt (!) 301 lb 6.4 oz (136.7 kg)    SpO2 97%    BMI 40.88 kg/m  , BMI Body mass index is 40.88 kg/m. GEN: Well nourished, well developed, in no acute distress  HEENT: normal  Neck: no JVD, carotid bruits, or masses Cardiac: RRR; no murmurs, rubs, or gallops,no edema  Respiratory:  clear to auscultation bilaterally, normal work of breathing GI: soft, nontender, nondistended, + BS MS: no deformity or atrophy  Skin: warm and dry, no rash Neuro:  Strength and sensation are intact Psych: euthymic mood, full affect   EKG:  EKG is not ordered today. The ekg ordered today demonstrates N/A   Recent Labs: 11/22/2019: ALT 24; Hemoglobin 16.3; Platelets 190 02/26/2020: BUN 31; Creatinine, Ser 1.60; Potassium 4.8; Sodium 145    Lipid Panel    Component Value Date/Time   CHOL 182 03/14/2019 0916   TRIG 104 03/14/2019 0916   HDL 49 03/14/2019 0916   CHOLHDL 3.7 03/14/2019 0916   LDLCALC 112 (H) 03/14/2019 0916    Dated 06/19/20: A1c 7.3%. cholesterolemia 203, triglycerides 141, HDL 42, LDL 157. Creatinine 1.65. BUN 33. Glucose 192. Potassium 4.1. Otherwise CMET and TSH normal.  Wt Readings from Last 3 Encounters:  07/15/20 (!) 301 lb 6.4 oz (136.7 kg)  02/27/20 (!) 307 lb 12.8 oz (139.6 kg)  01/16/20 (!) 303 lb 6.4 oz (137.6 kg)      Other studies Reviewed: Additional studies/ records that were reviewed today include:   CLINICAL DATA: Hyper  aldosteronism.  EXAM: CT ABDOMEN WITHOUT CONTRAST  TECHNIQUE: Multidetector CT imaging of the abdomen was performed following the standard protocol without IV contrast.  COMPARISON: None.  FINDINGS: Lower chest: No acute abnormality.  Hepatobiliary: No focal liver abnormality is seen. No gallstones, gallbladder wall thickening, or biliary dilatation.  Pancreas: Unremarkable. No pancreatic ductal dilatation or surrounding inflammatory changes.  Spleen: Normal in size without focal abnormality.  Adrenals/Urinary Tract: Left adrenal nodule measures 2.5 x 2.3 x 1.2 cm, best seen on coronal image 101/6. This measures low in  attenuation measuring 4.8 Hounsfield units likely reflecting underlying adenoma. Normal right adrenal gland.  Stomach/Bowel: Stomach is within normal limits. Appendix appears normal. No evidence of bowel wall thickening, distention, or inflammatory changes.  Vascular/Lymphatic: Aortic atherosclerosis. No aneurysm. No abdominopelvic adenopathy.  Other: No free fluid or fluid collections.  Musculoskeletal: No acute or significant osseous findings.  IMPRESSION: 1. No acute findings within the abdomen. 2. Left adrenal gland nodule is identified measuring 2.5 cm. This is favored to represent a benign adenoma. Consider follow-up imaging with adrenal protocol MRI or CT in 12 months. This recommendation follows ACR consensus guidelines: Management of Incidental Adrenal Masses: A White Paper of the ACR Incidental Findings Committee. J Am Coll Radiol 2017;14:1038-1044.  Aortic Atherosclerosis (ICD10-I70.0).   Electronically Signed By: Kerby Moors M.D. On: 12/13/2019 11:10  Myoview 11/15/18: Study Highlights    Blood pressure demonstrated a hypertensive response to exercise.  Nuclear stress EF: 44%.  There was no ST segment deviation noted during stress.  The left ventricular ejection fraction is moderately decreased  (30-44%).  This is an intermediate risk study.  Normal resting and stress perfusion. No ischemia or infarction EF EF estimated at 44% with LVE and diffuse hypokinesis suggest TTE/MRI correlation    Echo 09/07/18: Study Conclusions   - Left ventricle: The cavity size was normal. Wall thickness was  increased in a pattern of severe LVH. Systolic function was  normal. The estimated ejection fraction was in the range of 55%  to 60%. Wall motion was normal; there were no regional wall  motion abnormalities. Doppler parameters are consistent with  abnormal left ventricular relaxation (grade 1 diastolic  dysfunction).  - Aortic valve: Trileaflet; mildly thickened, mildly calcified  leaflets.  - Left atrium: The atrium was mildly dilated.  - Right ventricle: The cavity size was mildly dilated. Wall  thickness was normal.   Impressions:   - No cardiac source of emboli was indentified.    ASSESSMENT AND PLAN:  1. Hypertension: secondary to hyperaldosteronism. Chronic hypokalemia. Now on aldosterone with improvement. Recent increase today may be more related to recent stress and weight gain. We will monitor how his BP does with weight loss and stress reduction. If no better may increase aldactone to 100 mg daily. Potassium level now normal.   2. Hyperlipidemia: Cholesterol is uncontrolled. intolerant of higher dose statin. Focus on lifestyle modification and weight loss.   3. DM2: Managed by primary care provider  4. Obstructive sleep apnea: Continue CPAP therapy.  5. Obesity  6.  Left adrenal adenoma. Recommend follow up follow up imaging in one year - March 2022.   7.  CKD stage 3. Stable.    Current medicines are reviewed at length with the patient today.  The patient does not have concerns regarding medicines.  The following changes have been made:  no change  Labs/ tests ordered today include:  No orders of the defined types were placed in this  encounter.    Disposition:   FU with me in 6 months  Signed, Davontae Prusinski Martinique, MD  07/15/2020 9:29 AM    [facility] [address] [place], [place], [address] Phone [phone], Fax [phone]

## 2020-07-15 ENCOUNTER — Ambulatory Visit (INDEPENDENT_AMBULATORY_CARE_PROVIDER_SITE_OTHER): Payer: 59 | Admitting: Cardiology

## 2020-07-15 ENCOUNTER — Encounter: Payer: Self-pay | Admitting: Cardiology

## 2020-07-15 ENCOUNTER — Other Ambulatory Visit: Payer: Self-pay

## 2020-07-15 VITALS — BP 160/86 | HR 51 | Ht 72.0 in | Wt 301.4 lb

## 2020-07-15 DIAGNOSIS — I10 Essential (primary) hypertension: Secondary | ICD-10-CM

## 2020-07-15 DIAGNOSIS — E785 Hyperlipidemia, unspecified: Secondary | ICD-10-CM | POA: Diagnosis not present

## 2020-07-15 DIAGNOSIS — G4733 Obstructive sleep apnea (adult) (pediatric): Secondary | ICD-10-CM | POA: Diagnosis not present

## 2020-07-15 DIAGNOSIS — E269 Hyperaldosteronism, unspecified: Secondary | ICD-10-CM

## 2020-07-15 MED ORDER — ASPIRIN EC 81 MG PO TBEC: 81.0000 mg | DELAYED_RELEASE_TABLET | Freq: Every day | ORAL | 3 refills | Status: AC

## 2020-07-15 NOTE — Patient Instructions (Signed)
Medication Instructions:  Continue same medications *If you need a refill on your cardiac medications before your next appointment, please call your pharmacy*   Lab Work: None ordered   Testing/Procedures: Schedule Adrenal CT in 11/2020   Follow-Up: At [facility], you and your health needs are our priority.  As part of our continuing mission to provide you with exceptional heart care, we have created designated Provider Care Teams.  These Care Teams include your primary Cardiologist (physician) and Advanced Practice Providers (APPs -  Physician Assistants and Nurse Practitioners) who all work together to provide you with the care you need, when you need it.  We recommend signing up for the patient portal called "MyChart".  Sign up information is provided on this After Visit Summary.  MyChart is used to connect with patients for Virtual Visits (Telemedicine).  Patients are able to view lab/test results, encounter notes, upcoming appointments, etc.  Non-urgent messages can be sent to your provider as well.   To learn more about what you can do with MyChart, go to [email or web address].    Your next appointment:  6 months   Call in Feb to schedule April appointment    The format for your next appointment: Office   Provider:  Dr.Jordan

## 2020-08-07 IMAGING — CT CT ABDOMEN W/O CM
2 of 4 series · 16 of 46 positions shown, 18 images · non-contrast
Comparison: None.

CLINICAL DATA: Hyper aldosteronism.

EXAM:
CT ABDOMEN WITHOUT CONTRAST
TECHNIQUE: Multidetector CT imaging of the abdomen was performed following the
standard protocol without IV contrast.

[Series 3: a/p w/o 5mm · axial · non-contrast · 0.97mm/px · z∈[+1134,+1414]mm · 13 of 62 slices shown, 15 images]
[im 3/62  soft-tissue]
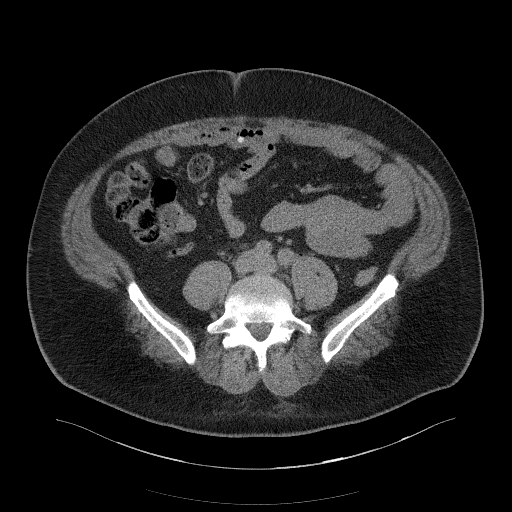
[im 3/62  bone]
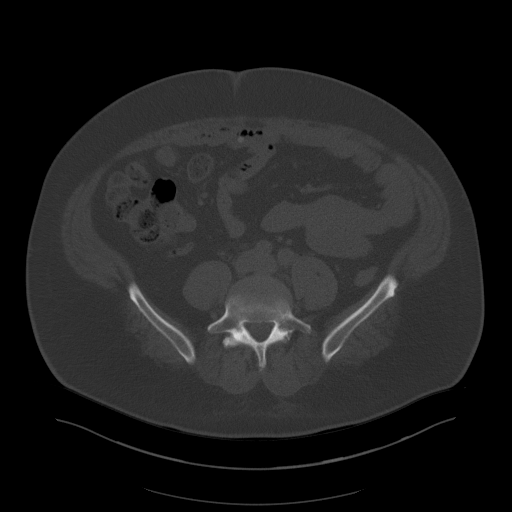
[im 9/62  soft-tissue]
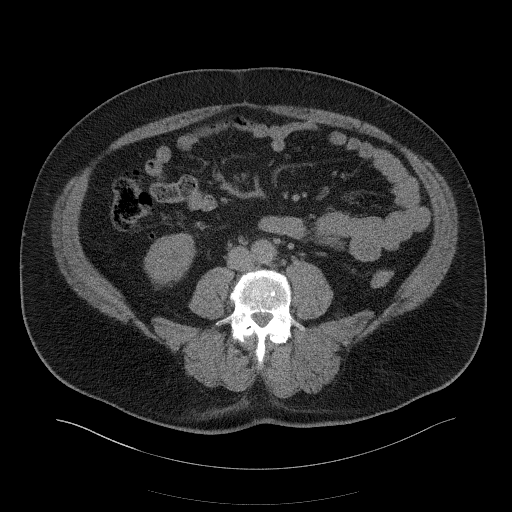
[im 12/62  soft-tissue]
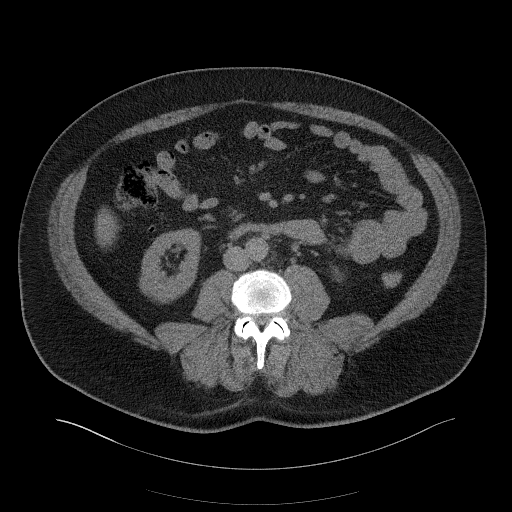
[im 18/62  soft-tissue]
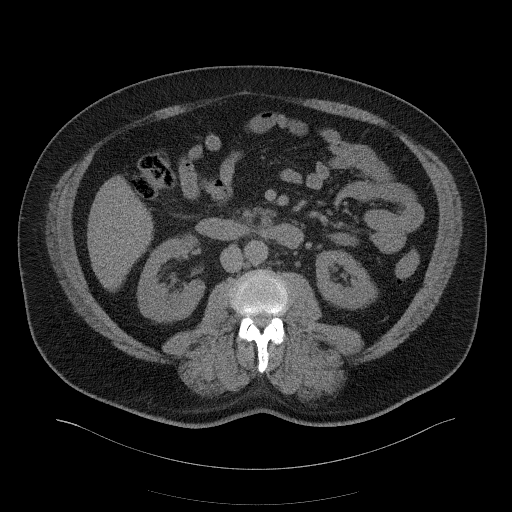
[im 21/62  soft-tissue]
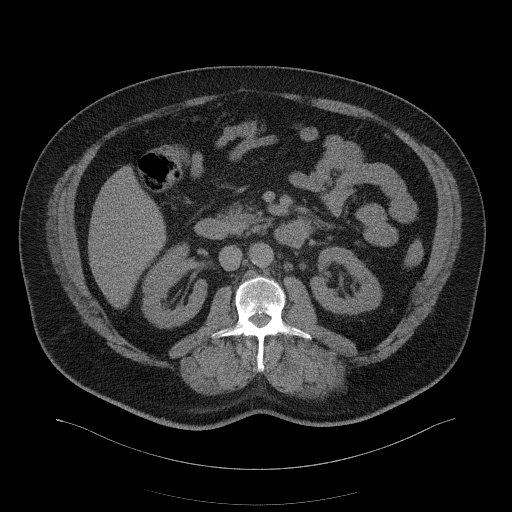
[im 27/62  soft-tissue]
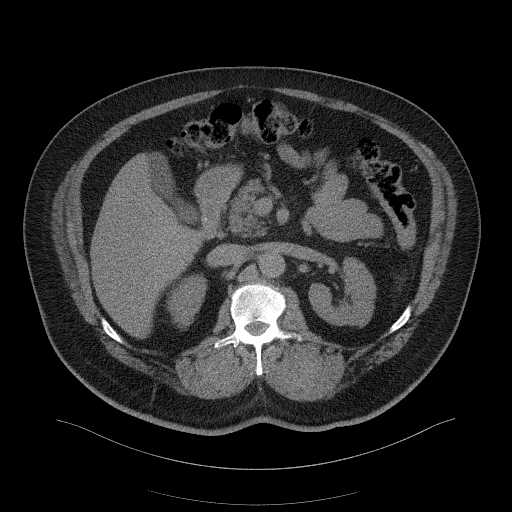
[im 32/62  soft-tissue]
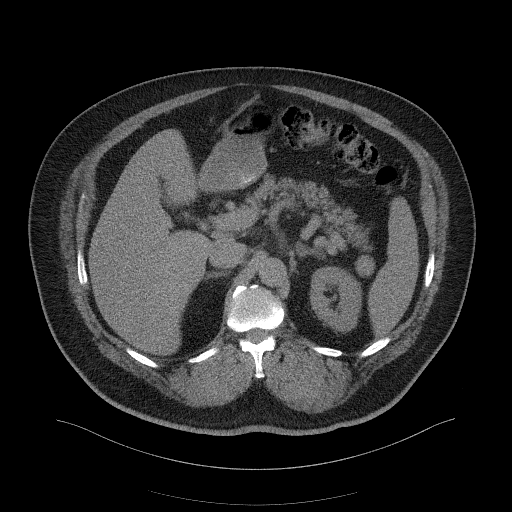
[im 35/62  soft-tissue]
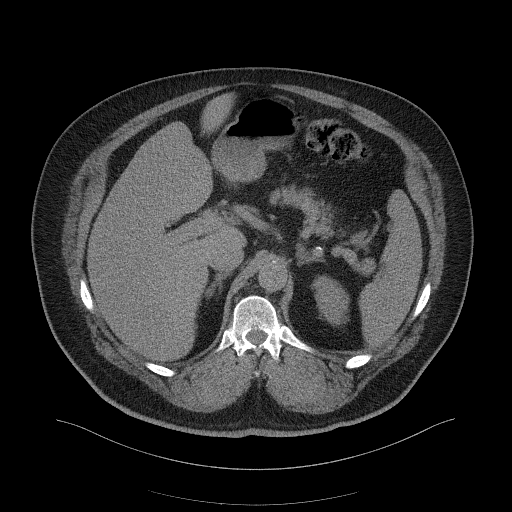
[im 41/62  soft-tissue]
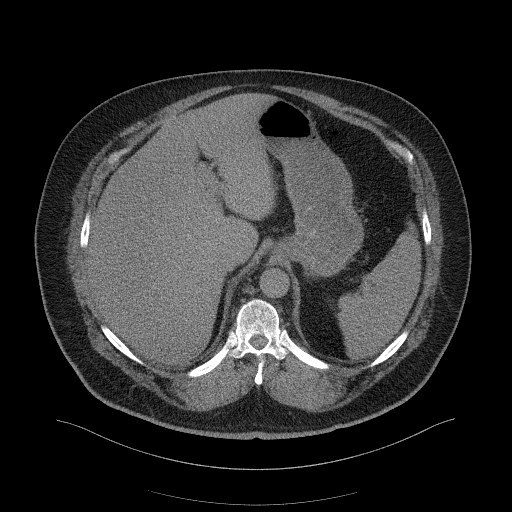
[im 41/62  bone]
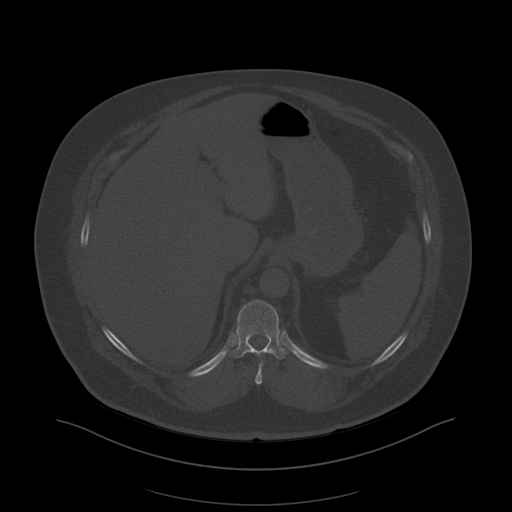
[im 44/62  soft-tissue]
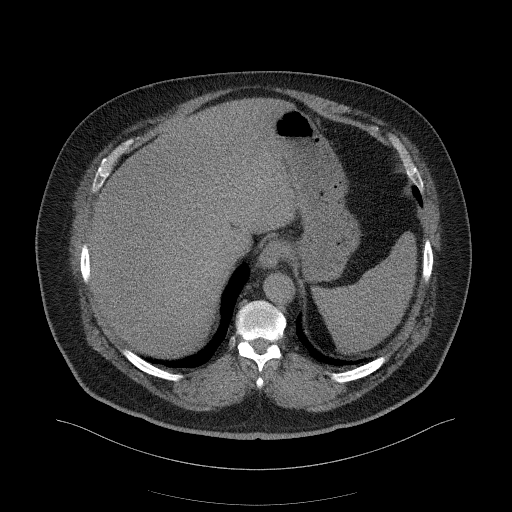
[im 50/62  soft-tissue]
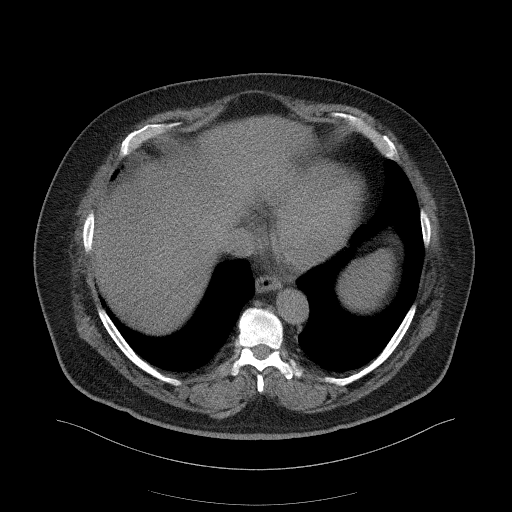
[im 53/62  soft-tissue]
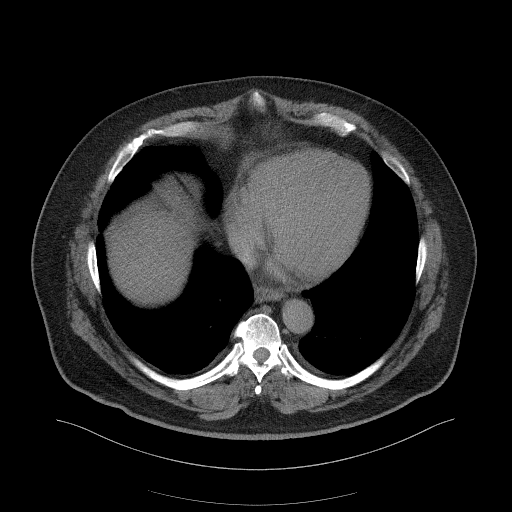
[im 59/62  soft-tissue]
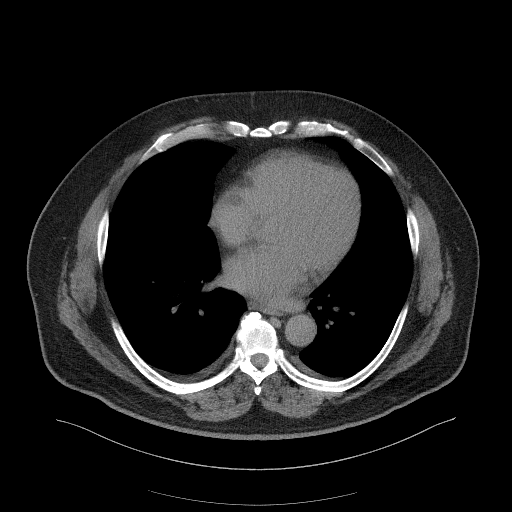

[Series 6: a/p w/o cor · coronal · non-contrast · 0.63mm/px · 3 of 197 slices shown]
[im 66/197  soft-tissue]
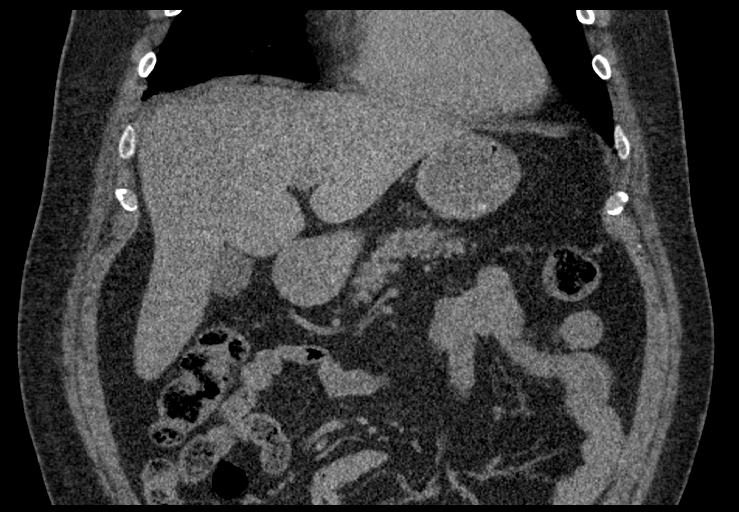
[im 88/197  soft-tissue]
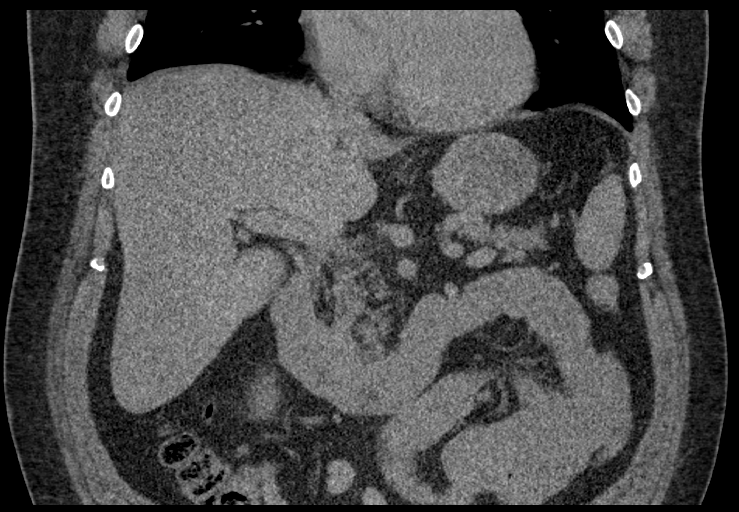
[im 109/197  soft-tissue]
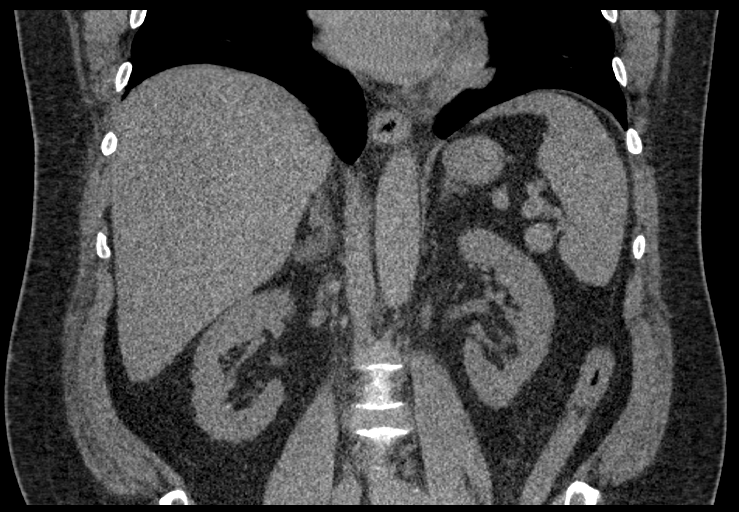

[16 of 46 positions shown; findings below may reference images not displayed]

FINDINGS: Lower chest: No acute abnormality.

Hepatobiliary: No focal liver abnormality is seen. No gallstones,
gallbladder wall thickening, or biliary dilatation.

Pancreas: Unremarkable. No pancreatic ductal dilatation or
surrounding inflammatory changes.

Spleen: Normal in size without focal abnormality.

Adrenals/Urinary Tract: Left adrenal nodule measures 2.5 x 2.3 x
cm, best seen on coronal image 101/6. This measures low in
attenuation measuring 4.8 Hounsfield units likely reflecting
underlying adenoma. Normal right adrenal gland.

Stomach/Bowel: Stomach is within normal limits. Appendix appears
normal. No evidence of bowel wall thickening, distention, or
inflammatory changes.

Vascular/Lymphatic: Aortic atherosclerosis. No aneurysm. No
abdominopelvic adenopathy.

Other: No free fluid or fluid collections.

Musculoskeletal: No acute or significant osseous findings.
IMPRESSION: 1. No acute findings within the abdomen.
2. Left adrenal gland nodule is identified measuring 2.5 cm. This is
favored to represent a benign adenoma. Consider follow-up imaging
with adrenal protocol MRI or CT in 12 months. This recommendation
follows ACR consensus guidelines: Management of Incidental Adrenal
Masses: A White Paper of the ACR Incidental Findings Committee. [HOSPITAL] 4227;14:4716-4766.

Aortic Atherosclerosis ([id number].O).

## 2020-09-02 ENCOUNTER — Encounter: Payer: Self-pay | Admitting: Adult Health

## 2020-09-02 ENCOUNTER — Ambulatory Visit (INDEPENDENT_AMBULATORY_CARE_PROVIDER_SITE_OTHER): Payer: 59 | Admitting: Adult Health

## 2020-09-02 ENCOUNTER — Ambulatory Visit: Payer: 59 | Admitting: Adult Health

## 2020-09-02 VITALS — BP 177/86 | HR 63 | Ht 71.0 in | Wt 300.0 lb

## 2020-09-02 DIAGNOSIS — G4733 Obstructive sleep apnea (adult) (pediatric): Secondary | ICD-10-CM | POA: Diagnosis not present

## 2020-09-02 DIAGNOSIS — Z9989 Dependence on other enabling machines and devices: Secondary | ICD-10-CM

## 2020-09-02 NOTE — Patient Instructions (Signed)
Continue using CPAP nightly and greater than 4 hours each night °If your symptoms worsen or you develop new symptoms please let us know.  ° °

## 2020-09-02 NOTE — Progress Notes (Signed)
PATIENT: Derek Duarte DOB: Sep 25, 1961  REASON FOR VISIT: follow up HISTORY FROM: patient  HISTORY OF PRESENT ILLNESS: Today 09/02/20   Derek Duarte is a 59 year old male with a history of obstructive sleep apnea on CPAP.  He returns today for follow-up.  His download indicates that he uses machine nightly for compliance of 100%.  He uses machine greater than 4 hours 21 out of 30 days for compliance of 70%.  On average he uses his machine 5 hours and 11 minutes.  His residual AHI is 0.4 on 7 to 14 cm of water with EPR 2.  Leak in the 95th percentile is 32.1 L/min.  He reports that the CPAP continues to work well for him.  He states that he has been under more stress lately as he recently was laid off from his job.  HISTORY 09/03/19: Derek Duarte is a 59 year old male with a history of obstructive sleep apnea on CPAP.  He returns today for follow-up.  His download indicates that he uses machine 29 out of 30 days for compliance of 97%.  He uses machine greater than 4 hours 9 days for compliance of 30%.  He states that he still has a lot of congestion at night and often has to take off the mask.  He has not seen his primary care or ENT for congestion.  His residual AHI is 0.3 on 7 to 14 cm of water with EPR of 2.  His leak in the 95th percentile is 30.7 L/min.  He states that he does not feel the machine leaking at night.  He also states that he is not seen a big change in how he feels after using the CPAP.  He returns today for an evaluation.  REVIEW OF SYSTEMS: Out of a complete 14 system review of symptoms, the patient complains only of the following symptoms, and all other reviewed systems are negative.  FSS 17 ESS 2  ALLERGIES: No Known Allergies  HOME MEDICATIONS: Outpatient Medications Prior to Visit  Medication Sig Dispense Refill  . allopurinol (ZYLOPRIM) 300 MG tablet Take 300 mg by mouth daily.    Marland Kitchen amLODipine (NORVASC) 5 MG tablet Take 5 mg by mouth daily.    Marland Kitchen aspirin  EC 81 MG tablet Take 1 tablet (81 mg total) by mouth daily. Swallow whole. 90 tablet 3  . buPROPion (WELLBUTRIN XL) 150 MG 24 hr tablet Take 450 mg by mouth daily. Patient takes 3 tablets daily  2  . carvedilol (COREG) 25 MG tablet Take 25 mg by mouth 2 (two) times daily.    . cholecalciferol (VITAMIN D3) 25 MCG (1000 UNIT) tablet Take 1,000 Units by mouth daily.    Marland Kitchen co-enzyme Q-10 30 MG capsule Take 30 mg by mouth daily.    . dapagliflozin propanediol (FARXIGA) 10 MG TABS tablet Take 10 mg by mouth daily.    Marland Kitchen doxazosin (CARDURA) 4 MG tablet TAKE 1 TABLET BY MOUTH EVERY DAY 90 tablet 1  . escitalopram (LEXAPRO) 10 MG tablet Take 10 mg by mouth daily.    . metFORMIN (GLUCOPHAGE) 500 MG tablet Take 500 mg by mouth 2 (two) times daily with a meal. 1 tab 2x a day.    . Omega-3 Fatty Acids (FISH OIL) 1000 MG CPDR Take 1,000 mg by mouth daily.     . pravastatin (PRAVACHOL) 20 MG tablet Take by mouth.    . spironolactone (ALDACTONE) 50 MG tablet TAKE 1 TABLET BY MOUTH EVERY DAY 90 tablet 0  .  tamsulosin (FLOMAX) 0.4 MG CAPS capsule Take 0.4 mg by mouth daily.  4  . telmisartan-hydrochlorothiazide (MICARDIS HCT) 40-12.5 MG tablet Take 1 tablet by mouth daily. 90 tablet 3  . traZODone (DESYREL) 100 MG tablet Take 100 mg by mouth at bedtime.     . vitamin B-12 (CYANOCOBALAMIN) 1000 MCG tablet Take 1,000 mcg by mouth daily.    . magnesium gluconate (MAGONATE) 500 MG tablet Take 500 mg by mouth daily.     No facility-administered medications prior to visit.    PAST MEDICAL HISTORY: Past Medical History:  Diagnosis Date  . BPH (benign prostatic hyperplasia)   . Depression   . Diabetes mellitus without complication ([place])   . Gout   . Hyperlipidemia   . Hypertension   . Insomnia   . Low testosterone     PAST SURGICAL HISTORY: Past Surgical History:  Procedure Laterality Date  . HERNIA REPAIR      FAMILY HISTORY: Family History  Problem Relation Age of Onset  . Hypertension Other   .  Atrial fibrillation Mother        had ablation    SOCIAL HISTORY: Social History   Socioeconomic History  . Marital status: Married    Spouse name: Not on file  . Number of children: Not on file  . Years of education: Not on file  . Highest education level: Not on file  Occupational History  . Not on file  Tobacco Use  . Smoking status: Never Smoker  . Smokeless tobacco: Never Used  Substance and Sexual Activity  . Alcohol use: Yes    Alcohol/week: 2.0 standard drinks    Types: 2 Cans of beer per week  . Drug use: Not Currently  . Sexual activity: Not on file  Other Topics Concern  . Not on file  Social History Narrative  . Not on file   Social Determinants of Health   Financial Resource Strain:   . Difficulty of Paying Living Expenses: Not on file  Food Insecurity:   . Worried About Charity fundraiser in the Last Year: Not on file  . Ran Out of Food in the Last Year: Not on file  Transportation Needs:   . Lack of Transportation (Medical): Not on file  . Lack of Transportation (Non-Medical): Not on file  Physical Activity:   . Days of Exercise per Week: Not on file  . Minutes of Exercise per Session: Not on file  Stress:   . Feeling of Stress : Not on file  Social Connections:   . Frequency of Communication with Friends and Family: Not on file  . Frequency of Social Gatherings with Friends and Family: Not on file  . Attends Religious Services: Not on file  . Active Member of Clubs or Organizations: Not on file  . Attends Archivist Meetings: Not on file  . Marital Status: Not on file  Intimate Partner Violence:   . Fear of Current or Ex-Partner: Not on file  . Emotionally Abused: Not on file  . Physically Abused: Not on file  . Sexually Abused: Not on file      PHYSICAL EXAM  Vitals:   09/02/20 1001  BP: (!) 177/86  Pulse: 63  Weight: 300 lb (136.1 kg)  Height: 5\' 11"  (1.803 m)   Body mass index is 41.84 kg/m.  Generalized: Well  developed, in no acute distress  Chest: Lungs clear to auscultation bilaterally  Neurological examination  Mentation: Alert oriented to time, place,  history taking. Follows all commands speech and language fluent Cranial nerve II-XII: Extraocular movements were full, visual field were full on confrontational test Head turning and shoulder shrug  were normal and symmetric. Motor: The motor testing reveals 5 over 5 strength of all 4 extremities. Good symmetric motor tone is noted throughout.  Sensory: Sensory testing is intact to soft touch on all 4 extremities. No evidence of extinction is noted.  Gait and station: Gait is normal.    DIAGNOSTIC DATA (LABS, IMAGING, TESTING) - I reviewed patient records, labs, notes, testing and imaging myself where available.  Lab Results  Component Value Date   WBC 7.2 11/22/2019   HGB 16.3 11/22/2019   HCT 48.0 11/22/2019   MCV 82.3 11/22/2019   PLT 190 11/22/2019      Component Value Date/Time   NA 145 (H) 02/26/2020 1234   NA 145 09/29/2011 1144   K 4.8 02/26/2020 1234   K 3.5 09/29/2011 1144   CL 107 (H) 02/26/2020 1234   CL 106 09/29/2011 1144   CO2 23 02/26/2020 1234   CO2 29 09/29/2011 1144   GLUCOSE 196 (H) 02/26/2020 1234   GLUCOSE 196 (H) 11/22/2019 0909   GLUCOSE 142 (H) 09/29/2011 1144   BUN 31 (H) 02/26/2020 1234   BUN 17 09/29/2011 1144   CREATININE 1.60 (H) 02/26/2020 1234   CREATININE 0.84 09/29/2011 1144   CALCIUM 9.4 02/26/2020 1234   CALCIUM 9.1 09/29/2011 1144   PROT 6.6 11/22/2019 0903   ALBUMIN 3.7 11/22/2019 0903   AST 19 11/22/2019 0903   ALT 24 11/22/2019 0903   ALKPHOS 111 11/22/2019 0903   BILITOT 0.9 11/22/2019 0903   GFRNONAA 47 (L) 02/26/2020 1234   GFRNONAA >60 09/29/2011 1144   GFRAA 54 (L) 02/26/2020 1234   GFRAA >60 09/29/2011 1144   Lab Results  Component Value Date   CHOL 182 03/14/2019   HDL 49 03/14/2019   LDLCALC 112 (H) 03/14/2019   TRIG 104 03/14/2019   CHOLHDL 3.7 03/14/2019   No  results found for: HGBA1C Lab Results  Component Value Date   VITAMINB12 360 03/14/2019   Lab Results  Component Value Date   TSH 1.890 03/14/2019      ASSESSMENT AND PLAN 59 y.o. year old male  has a past medical history of BPH (benign prostatic hyperplasia), Depression, Diabetes mellitus without complication ([place]), Gout, Hyperlipidemia, Hypertension, Insomnia, and Low testosterone. here with:  1. OSA on CPAP  - CPAP compliance excellent - Good treatment of AHI  - Encourage patient to use CPAP nightly and > 4 hours each night - F/U in 1 year or sooner if needed   I spent 25 minutes of face-to-face and non-face-to-face time with patient.  This included previsit chart review, lab review, study review, order entry, electronic health record documentation, patient education.  Ward Givens, MSN, NP-C 09/02/2020, 11:00 AM [facility] [address], [place], [place] [address] [phone]

## 2020-10-26 ENCOUNTER — Other Ambulatory Visit: Payer: Self-pay | Admitting: Orthopedic Surgery

## 2020-10-26 DIAGNOSIS — R531 Weakness: Secondary | ICD-10-CM

## 2020-10-26 DIAGNOSIS — M25561 Pain in right knee: Secondary | ICD-10-CM

## 2020-10-31 ENCOUNTER — Other Ambulatory Visit: Payer: Self-pay | Admitting: Cardiology

## 2020-11-12 ENCOUNTER — Other Ambulatory Visit: Payer: Self-pay

## 2020-11-12 ENCOUNTER — Ambulatory Visit
Admission: RE | Admit: 2020-11-12 | Discharge: 2020-11-12 | Disposition: A | Discharge status: Home / Self Care | Payer: 59 | Source: Ambulatory Visit | Attending: Orthopedic Surgery | Admitting: Orthopedic Surgery

## 2020-11-12 DIAGNOSIS — M25561 Pain in right knee: Secondary | ICD-10-CM

## 2020-11-12 DIAGNOSIS — R531 Weakness: Secondary | ICD-10-CM

## 2020-11-30 ENCOUNTER — Telehealth: Payer: Self-pay | Admitting: Cardiology

## 2020-11-30 NOTE — Telephone Encounter (Signed)
Left message for patient to call and discuss scheduling the CT adrenal abdomen ordered bu Dr. Martinique

## 2020-12-03 NOTE — Telephone Encounter (Signed)
Left message for patient to call and discuss scheduling the CT adrenal abdomen ordered by Dr. Dayle Points

## 2020-12-07 NOTE — Telephone Encounter (Signed)
Left message for patient to call and discuss scheduling the CT Adrenal Abdomen ordered by Dr. Martinique

## 2020-12-24 NOTE — Telephone Encounter (Signed)
Patient has not returned calls 11/30/20--12/03/20 or 12/07/20 to discuss scheduling the CT Adrenal Abdomen.  Please advise.

## 2020-12-24 NOTE — Telephone Encounter (Signed)
Yes he is due for f/u CT adrenal protocol for adenoma  Derek Duarte Martinique MD, [facility]

## 2020-12-28 NOTE — Telephone Encounter (Signed)
Spoke to patient he stated [place].He will schedule CT Adrenal Abdomen.I will send message to scheduler to be scheduled.

## 2020-12-28 NOTE — Telephone Encounter (Signed)
Spoke with patient regarding the Monday 01/04/21 8:00 am CT Adrenal Abdomen scheduled Monday 01/04/21 at 8:00 am at [facility] time is 7:45 am--1st floor radiology---NPO after midnight--pt states he vied the information in his My Chart.

## 2020-12-31 ENCOUNTER — Telehealth: Payer: Self-pay

## 2020-12-31 NOTE — Telephone Encounter (Signed)
Dr.Jordan performed physician to physician review for adrenal ct.Approved # [id number].

## 2021-01-04 ENCOUNTER — Other Ambulatory Visit: Payer: Self-pay

## 2021-01-04 ENCOUNTER — Ambulatory Visit (HOSPITAL_COMMUNITY)
Admission: RE | Admit: 2021-01-04 | Discharge: 2021-01-04 | Disposition: A | Discharge status: Home / Self Care | Payer: 59 | Source: Ambulatory Visit | Attending: Cardiology | Admitting: Cardiology

## 2021-01-04 DIAGNOSIS — E269 Hyperaldosteronism, unspecified: Secondary | ICD-10-CM | POA: Diagnosis present

## 2021-01-22 ENCOUNTER — Other Ambulatory Visit: Payer: Self-pay | Admitting: Cardiology

## 2021-01-22 MED ORDER — TELMISARTAN-HCTZ 40-12.5 MG PO TABS: 1.0000 | ORAL_TABLET | Freq: Every day | ORAL | 1 refills | Status: DC

## 2021-03-31 NOTE — Progress Notes (Signed)
Cardiology Office Note   Date:  04/02/2021   ID:  Derek Duarte, DOB 23-Aug-1961, MRN [id number]  PCP:  Lawerance Cruel, MD  Cardiologist:   Yaretzi Ernandez Martinique, MD   Chief Complaint  Patient presents with   Hypertension       History of Present Illness: Derek Duarte is a 59 y.o. male who is seen for follow up HTN and primary hyperaldosteronism. He has a hx of resistant hypertension, hypokalemia, hyperlipidemia, DM 2, TIA, OSA on CPAP and atypical chest pain.  Last echocardiogram obtained on 09/07/2018 showed EF 55%, severe LVH, grade 1 DD, mild LAE, otherwise no significant valve issue.  Patient was admitted in January 2020 for chest pain and abnormal EKG.  He was ruled out by troponin and EKG.  Lab work was concerning for severe hypokalemia at 2.7.  He had a Zio monitor at [facility] that showed rare PVC and bigeminy, otherwise no significant arrhythmia.  Myoview study in February 2020 showed no ischemia or infarction.  He has difficult to control blood pressure,    He has been followed by our Hypertensive clinic. With  persistent hypokalemia he had  Laboratory evaluation c/w primary hyperaldosteronism. Started on aldactone and potassium repleted. CT of adrenals showed a 2.5 cm left adrenal nodule felt to be a benign adenoma. Yearly follow up recommended.   On follow up today he reports BP is typically 137-145/80. He ran out of Micardis for a few days and BP immediately spiked. He is under increased stress since he lost his job last year. Recently refocused on low carb diet. Notes activity limited by neuropathy in feet. Last A1c 7.3%. Only a couple of brief atypical chest pain. CT in April showed no change in small left adrenal adenoma. He was noted to aortic atherosclerosis.    Past Medical History:  Diagnosis Date   BPH (benign prostatic hyperplasia)    Depression    Diabetes mellitus without complication ([place])    Gout    Hyperlipidemia    Hypertension     Insomnia    Low testosterone     Past Surgical History:  Procedure Laterality Date   HERNIA REPAIR       Current Outpatient Medications  Medication Sig Dispense Refill   allopurinol (ZYLOPRIM) 300 MG tablet Take 300 mg by mouth daily.     amLODipine (NORVASC) 5 MG tablet Take 5 mg by mouth daily.     aspirin EC 81 MG tablet Take 1 tablet (81 mg total) by mouth daily. Swallow whole. 90 tablet 3   buPROPion (WELLBUTRIN XL) 150 MG 24 hr tablet Take 450 mg by mouth daily. Patient takes 3 tablets daily  2   carvedilol (COREG) 25 MG tablet Take 25 mg by mouth 2 (two) times daily.     cholecalciferol (VITAMIN D3) 25 MCG (1000 UNIT) tablet Take by mouth daily. 2, 000 units daily     co-enzyme Q-10 30 MG capsule Take 30 mg by mouth daily.     dapagliflozin propanediol (FARXIGA) 10 MG TABS tablet Take 10 mg by mouth daily.     doxazosin (CARDURA) 4 MG tablet Take 1 tablet (4 mg total) by mouth daily. 90 tablet 3   escitalopram (LEXAPRO) 10 MG tablet Take 10 mg by mouth daily.     metFORMIN (GLUCOPHAGE) 500 MG tablet Take 500 mg by mouth 2 (two) times daily with a meal. 1 tab 2x a day.     pravastatin (PRAVACHOL) 20 MG tablet  Take by mouth once a week.     spironolactone (ALDACTONE) 50 MG tablet TAKE 1 TABLET BY MOUTH EVERY DAY 90 tablet 3   tamsulosin (FLOMAX) 0.4 MG CAPS capsule Take 0.4 mg by mouth daily.  4   telmisartan-hydrochlorothiazide (MICARDIS HCT) 40-12.5 MG tablet Take 1 tablet by mouth daily. 90 tablet 1   traZODone (DESYREL) 100 MG tablet Take 100 mg by mouth at bedtime.      vitamin B-12 (CYANOCOBALAMIN) 1000 MCG tablet Take 1,000 mcg by mouth daily.     No current facility-administered medications for this visit.    Allergies:   Patient has no known allergies.    Social History:  The patient  reports that he has never smoked. He has never used smokeless tobacco. He reports current alcohol use of about 2.0 standard drinks of alcohol per week. He reports previous drug use.    Family History:  The patient's family history includes Atrial fibrillation in his mother; Hypertension in an other family member.    ROS:  Please see the history of present illness.   Otherwise, review of systems are positive for none.   All other systems are reviewed and negative.    PHYSICAL EXAM: VS:  BP (!) 144/82   Pulse 64   Resp 18   Ht 6' (1.829 m)   Wt (!) 304 lb 6.4 oz (138.1 kg)   SpO2 97%   BMI 41.28 kg/m  , BMI Body mass index is 41.28 kg/m. GEN: Well nourished, obese, in no acute distress  HEENT: normal  Neck: no JVD, carotid bruits, or masses Cardiac: RRR; no murmurs, rubs, or gallops,no edema  Respiratory:  clear to auscultation bilaterally, normal work of breathing GI: soft, nontender, nondistended, + BS MS: no deformity or atrophy  Skin: warm and dry, no rash Neuro:  Strength and sensation are intact Psych: euthymic mood, full affect   EKG:  EKG is ordered today. The ekg ordered today demonstrates NSR with occ PVC. Rate 64. Otherwise normal. I have personally reviewed and interpreted this study.    Recent Labs: No results found for requested labs within last 8760 hours.    Lipid Panel    Component Value Date/Time   CHOL 182 03/14/2019 0916   TRIG 104 03/14/2019 0916   HDL 49 03/14/2019 0916   CHOLHDL 3.7 03/14/2019 0916   LDLCALC 112 (H) 03/14/2019 0916    Dated 06/19/20: A1c 7.3%. cholesterolemia 203, triglycerides 141, HDL 42, LDL 157. Creatinine 1.65. BUN 33. Glucose 192. Potassium 4.1. Otherwise CMET and TSH normal. Dated 12/22/20: A1c 7.4%. creatinine 1.77. potassium 4.4.   Wt Readings from Last 3 Encounters:  04/02/21 (!) 304 lb 6.4 oz (138.1 kg)  09/02/20 300 lb (136.1 kg)  07/15/20 (!) 301 lb 6.4 oz (136.7 kg)      Other studies Reviewed: Additional studies/ records that were reviewed today include:   CMyoview 11/15/18: Study Highlights     Blood pressure demonstrated a hypertensive response to exercise. Nuclear stress EF:  44%. There was no ST segment deviation noted during stress. The left ventricular ejection fraction is moderately decreased (30-44%). This is an intermediate risk study.   Normal resting and stress perfusion. No ischemia or infarction EF EF estimated at 44% with LVE and diffuse hypokinesis suggest TTE/MRI correlation     Echo 09/07/18: Study Conclusions   - Left ventricle: The cavity size was normal. Wall thickness was    increased in a pattern of severe LVH. Systolic function was  normal. The estimated ejection fraction was in the range of 55%    to 60%. Wall motion was normal; there were no regional wall    motion abnormalities. Doppler parameters are consistent with    abnormal left ventricular relaxation (grade 1 diastolic    dysfunction).  - Aortic valve: Trileaflet; mildly thickened, mildly calcified    leaflets.  - Left atrium: The atrium was mildly dilated.  - Right ventricle: The cavity size was mildly dilated. Wall    thickness was normal.   Impressions:   - No cardiac source of emboli was indentified.  CLINICAL DATA:  Hyperaldosteronism.  Adrenal mass.   EXAM: CT ABDOMEN WITHOUT CONTRAST   TECHNIQUE: Multidetector CT imaging of the abdomen was performed following the standard protocol without IV contrast.   COMPARISON:  12/13/2019   FINDINGS: Lower chest: Unremarkable   Hepatobiliary: Unremarkable   Pancreas: Unremarkable   Spleen: Unremarkable   Adrenals/Urinary Tract: Faint nodularity in the left adrenal gland measuring about 2.3 by 1.3 cm on image 96 series 5, density below 10 Hounsfield units suggesting adenoma. There is also a 0.6 cm fat density in the left adrenal gland on image 100 series 5 compatible with myelolipoma. Minimal nodularity in the lateral limb right adrenal gland at 0.7 by 1.1 cm on image 116 series 5.   There are 2 small right kidney lower pole nonobstructive renal calculi, the larger measuring 0.2 cm in long axis. No  hydronephrosis or hydroureter.   Stomach/Bowel: Unremarkable   Vascular/Lymphatic: Aortoiliac atherosclerotic vascular disease.   Other: No supplemental non-categorized findings.   Musculoskeletal: Lumbar spondylosis and degenerative disc disease resulting in multilevel impingement.   IMPRESSION: 1. Stable mild nodularity in both adrenal glands, left greater than right. The small left adrenal nodule has a density favoring adenoma, and there is also a small left myelolipoma. 2. Nonobstructive right nephrolithiasis. 3. Lumbar spondylosis and degenerative disc disease causing multilevel impingement. 4.  Aortic Atherosclerosis (ICD10-I70.0).     Electronically Signed   By: Van Clines M.D.   On: 01/04/2021 16:35    ASSESSMENT AND PLAN:  Hypertension: secondary to hyperaldosteronism. Chronic hypokalemia. BP is fairly well controlled on multiple meds. Encourage lifestyle modification with weight loss and increase aerobic activity.    Hyperlipidemia: Cholesterol is uncontrolled. intolerant of higher dose statin. Now on pravastatin one day a week. Focus on lifestyle modification and weight loss. Planning repeat lab work with Dr Harrington Challenger in September. If LDL is not at goal would recommend starting a PCSK 9 inhibitor.     DM2: Managed by primary care provider. Has neuropathy.    Obstructive sleep apnea: Continue CPAP therapy.   5. Obesity  6.  Left adrenal adenoma. Stable by CT in April. No further follow up needed.  7.  CKD stage 3. Stable.   8. Aortic atherosclerosis. Risk factor modification as above.    Current medicines are reviewed at length with the patient today.  The patient does not have concerns regarding medicines.  The following changes have been made:  no change  Labs/ tests ordered today include:  No orders of the defined types were placed in this encounter.    Disposition:   FU with me in 6 months  Signed, Amanda Pote Martinique, MD  04/02/2021 4:48 PM    [facility] [address], [place], [place], [address] Phone [phone], Fax [phone]

## 2021-04-02 ENCOUNTER — Ambulatory Visit (INDEPENDENT_AMBULATORY_CARE_PROVIDER_SITE_OTHER): Payer: 59 | Admitting: Cardiology

## 2021-04-02 ENCOUNTER — Encounter: Payer: Self-pay | Admitting: Cardiology

## 2021-04-02 ENCOUNTER — Other Ambulatory Visit: Payer: Self-pay

## 2021-04-02 VITALS — BP 144/82 | HR 64 | Resp 18 | Ht 72.0 in | Wt 304.4 lb

## 2021-04-02 DIAGNOSIS — I10 Essential (primary) hypertension: Secondary | ICD-10-CM

## 2021-04-02 DIAGNOSIS — I700 Atherosclerosis of aorta: Secondary | ICD-10-CM

## 2021-04-02 DIAGNOSIS — E269 Hyperaldosteronism, unspecified: Secondary | ICD-10-CM | POA: Diagnosis not present

## 2021-04-02 DIAGNOSIS — E785 Hyperlipidemia, unspecified: Secondary | ICD-10-CM | POA: Diagnosis not present

## 2021-06-18 ENCOUNTER — Other Ambulatory Visit: Payer: Self-pay | Admitting: Cardiology

## 2021-07-05 ENCOUNTER — Telehealth: Payer: Self-pay | Admitting: Cardiology

## 2021-07-05 NOTE — Telephone Encounter (Signed)
Pt c/o BP issue: STAT if pt c/o blurred vision, one-sided weakness or slurred speech  1. What are your last 5 BP readings? Today 151/94 but ranging 150-160/80-90  2. Are you having any other symptoms (ex. Dizziness, headache, blurred vision, passed out)? headache  3. What is your BP issue? Elevated BP   Per pt schedules, pt states:  "Blood pressure is back up, don't know if adrenals are playing up , as I have had kidney ache in recents weeks   151/94 today.  eagle have all new BP parameters and insisting on regular checkups , rather do it through your team, if needed.  Back at gym from last week with a trainer, and exercising , lost minimal lbs so far , but doing something about it.    Open for any time to visit. "

## 2021-07-05 NOTE — Telephone Encounter (Signed)
Spoke with pt regarding elevated blood pressure. Pt has been seen recently by his PCP twice in the last couple of weeks and both times his blood pressure has been elevated. Today blood pressure was 151/94. Pt would like to be seen in the office for medication titration. Pt states that he is open to an appointment at any time of the day. Will forward to provider and nurse for an appointment.

## 2021-07-05 NOTE — Telephone Encounter (Signed)
Spoke to patient Dr.Jordan advised to schedule appointment with HTN clinic.Advised I will call him back in the morning with appointment.

## 2021-07-06 NOTE — Telephone Encounter (Signed)
Spoke to patient HTN clinic appointment with Pharm D scheduled 07/29/21 10:30 am.

## 2021-07-29 ENCOUNTER — Other Ambulatory Visit: Payer: Self-pay

## 2021-07-29 ENCOUNTER — Ambulatory Visit (INDEPENDENT_AMBULATORY_CARE_PROVIDER_SITE_OTHER): Payer: 59 | Admitting: Pharmacist Clinician (PhC)/ Clinical Pharmacy Specialist

## 2021-07-29 VITALS — BP 142/82 | HR 64 | Ht 72.0 in | Wt 303.0 lb

## 2021-07-29 DIAGNOSIS — I10 Essential (primary) hypertension: Secondary | ICD-10-CM

## 2021-07-29 MED ORDER — SPIRONOLACTONE 50 MG PO TABS: 50.0000 mg | ORAL_TABLET | Freq: Every day | ORAL | 1 refills | Status: AC

## 2021-07-29 NOTE — Progress Notes (Signed)
07/30/2021 Derek Duarte 02-19-1961 [id number]   HPI:  Derek Duarte is a 60 y.o. male patient of Dr Martinique, with a [employer] below who presents today for hypertension clinic evaluation.  In March 2021, aldosterone/renin labs showed him to be positive for hyperaldosteronism with a ratio of >71.9.  Adrenal CT showed a nodule on the left adrenal gland.  Follow up a year later showed no change, but also noted a minimal nodularity on the right adrenal gland.  He is currently on spironolactone 50 mg daily, in addition to telmisartan hctz, doxazosin, amlodipine and carvedilol.    We saw him last just over a year ago, at which time his pressures were much improved and holding steady.  Recently he was seen at his PCP office and at two different visits it was noted his BP was again elevated.  He asked to be seen in cardiology for follow up.  Today he notes the headaches that usually accompany higher BP readings are back.  Admits needs to work on Designer, multimedia habits.  Was laid off his job in the past year, and has recently started going to the gym twice weekly.   Currently he is planing a 6+ week sailing expedition with a friend, from [place], [place] to the [place].  Hoping to do this in January.    Past Medical History: hyperaldosteronism On spironolactone, most recent K at 4.4  DM2  A1c 6.7 (9/22), Farxiga 10 mg, metformin 500 mg bid  TIA 2019 unsure, some balance issues     Blood Pressure Goal:  130/80  Current Medications: telmisartan hctz 40/12.5 qd, spironolactone 50 mg qd, doxazosin 4 mg qd, amlodipine 5 mg qd, carvedilol 25 mg bid  Family Hx: paternal headaches with hypertension, maternal obesity with hypertension  Social Hx:  no tobacco, rare alcohol; black tea with milk 2 cups per day, no soda   Diet: always eats home cooked balanced meals, no starches (eats cauliflower rice or mashed cauliflower), patient plans to resume keto diet this week;  Gave up banana.  Keto-esque  diet, but no weight loss let.   Exercise: Gym membership now not sure what to do, so going bid, - wants to get back now vaccinated, walks when not too cold outside   Home BP readings:   no readings with him today, but notes seeing mostly 371-696 systolic at home.  If rests and does some deep breathing, can get down to mid 130's.    Intolerances: nkda  Labs: 9/22:  Na 146, K 4.4, Glu 150, BUN 31, SCr 1.67, CrCl    Wt Readings from Last 3 Encounters:  07/29/21 (!) 303 lb (137.4 kg)  04/02/21 (!) 304 lb 6.4 oz (138.1 kg)  09/02/20 300 lb (136.1 kg)   BP Readings from Last 3 Encounters:  07/29/21 (!) 142/82  04/02/21 (!) 144/82  09/02/20 (!) 177/86   Pulse Readings from Last 3 Encounters:  07/29/21 64  04/02/21 64  09/02/20 63    Current Outpatient Medications  Medication Sig Dispense Refill   spironolactone (ALDACTONE) 50 MG tablet Take 1 tablet (50 mg total) by mouth daily. 135 tablet 1   allopurinol (ZYLOPRIM) 300 MG tablet Take 300 mg by mouth daily.     amLODipine (NORVASC) 5 MG tablet Take 5 mg by mouth daily.     aspirin EC 81 MG tablet Take 1 tablet (81 mg total) by mouth daily. Swallow whole. 90 tablet 3   buPROPion (WELLBUTRIN XL) 150 MG 24 hr tablet  Take 450 mg by mouth daily. Patient takes 3 tablets daily  2   carvedilol (COREG) 25 MG tablet Take 25 mg by mouth 2 (two) times daily.     cholecalciferol (VITAMIN D3) 25 MCG (1000 UNIT) tablet Take by mouth daily. 2, 000 units daily     co-enzyme Q-10 30 MG capsule Take 30 mg by mouth daily.     dapagliflozin propanediol (FARXIGA) 10 MG TABS tablet Take 10 mg by mouth daily.     doxazosin (CARDURA) 4 MG tablet Take 1 tablet (4 mg total) by mouth daily. 90 tablet 3   escitalopram (LEXAPRO) 10 MG tablet Take 10 mg by mouth daily.     metFORMIN (GLUCOPHAGE) 500 MG tablet Take 500 mg by mouth 2 (two) times daily with a meal. 1 tab 2x a day.     pravastatin (PRAVACHOL) 20 MG tablet Take by mouth once a week.     tamsulosin  (FLOMAX) 0.4 MG CAPS capsule Take 0.4 mg by mouth daily.  4   telmisartan-hydrochlorothiazide (MICARDIS HCT) 40-12.5 MG tablet TAKE 1 TABLET BY MOUTH EVERY DAY 90 tablet 1   traZODone (DESYREL) 100 MG tablet Take 100 mg by mouth at bedtime.      vitamin B-12 (CYANOCOBALAMIN) 1000 MCG tablet Take 1,000 mcg by mouth daily.     No current facility-administered medications for this visit.    No Known Allergies  Past Medical History:  Diagnosis Date   BPH (benign prostatic hyperplasia)    Depression    Diabetes mellitus without complication (HCC)    Gout    Hyperlipidemia    Hypertension    Insomnia    Low testosterone     Blood pressure (!) 142/82, pulse 64, height 6' (1.829 m), weight (!) 303 lb (137.4 kg).  Essential hypertension Patient with hypertension secondary to hyperaldosteronism.  Will have him increase the spironolactone to 75 mg daily.  He will need to repeat BMET in 7-10 days.  He will continue with home monitoring and reach out should this not bring his home readings down.  He will also continue to increase exercise and work on weight loss.     [name] PharmD CPP [facility] [address] [place], [place] [address] [phone]

## 2021-07-29 NOTE — Patient Instructions (Signed)
Reach out to [place] in 4-5 weeks (between the holidays) to let me know how your readings are doing.    Go to the lab in 2 weeks to check potassium   Check your blood pressure at home daily and keep record of the readings.  Take your BP meds as follows:  Increase spironolactone to 75 mg once daily.   Continue with all other medications  Bring all of your meds, your BP cuff and your record of home blood pressures to your next appointment.  Exercise as you're able, try to walk approximately 30 minutes per day.  Keep salt intake to a minimum, especially watch canned and prepared boxed foods.  Eat more fresh fruits and vegetables and fewer canned items.  Avoid eating in fast food restaurants.    HOW TO TAKE YOUR BLOOD PRESSURE: Rest 5 minutes before taking your blood pressure.  Don't smoke or drink caffeinated beverages for at least 30 minutes before. Take your blood pressure before (not after) you eat. Sit comfortably with your back supported and both feet on the floor (don't cross your legs). Elevate your arm to heart level on a table or a desk. Use the proper sized cuff. It should fit smoothly and snugly around your bare upper arm. There should be enough room to slip a fingertip under the cuff. The bottom edge of the cuff should be 1 inch above the crease of the elbow. Ideally, take 3 measurements at one sitting and record the averag

## 2021-07-30 ENCOUNTER — Encounter: Payer: Self-pay | Admitting: Pharmacist Clinician (PhC)/ Clinical Pharmacy Specialist

## 2021-07-30 NOTE — Assessment & Plan Note (Signed)
Patient with hypertension secondary to hyperaldosteronism.  Will have him increase the spironolactone to 75 mg daily.  He will need to repeat BMET in 7-10 days.  He will continue with home monitoring and reach out should this not bring his home readings down.  He will also continue to increase exercise and work on weight loss.

## 2021-08-18 ENCOUNTER — Other Ambulatory Visit: Payer: Self-pay

## 2021-08-18 DIAGNOSIS — I10 Essential (primary) hypertension: Secondary | ICD-10-CM

## 2021-08-18 LAB — BASIC METABOLIC PANEL
BUN/Creatinine Ratio: 18 (ref 10–24)
BUN: 35 mg/dL — ABNORMAL HIGH (ref 8–27)
CO2: 19 mmol/L — ABNORMAL LOW (ref 20–29)
Calcium: 9.2 mg/dL (ref 8.6–10.2)
Chloride: 103 mmol/L (ref 96–106)
Creatinine, Ser: 1.93 mg/dL — ABNORMAL HIGH (ref 0.76–1.27)
Glucose: 209 mg/dL — ABNORMAL HIGH (ref 70–99)
Potassium: 4.5 mmol/L (ref 3.5–5.2)
Sodium: 139 mmol/L (ref 134–144)
eGFR: 39 mL/min/{1.73_m2} — ABNORMAL LOW (ref >59)

## 2021-09-02 LAB — BASIC METABOLIC PANEL
BUN/Creatinine Ratio: 17 (ref 10–24)
BUN: 32 mg/dL — ABNORMAL HIGH (ref 8–27)
CO2: 21 mmol/L (ref 20–29)
Calcium: 9.6 mg/dL (ref 8.6–10.2)
Chloride: 102 mmol/L (ref 96–106)
Creatinine, Ser: 1.89 mg/dL — ABNORMAL HIGH (ref 0.76–1.27)
Glucose: 287 mg/dL — ABNORMAL HIGH (ref 70–99)
Potassium: 4.7 mmol/L (ref 3.5–5.2)
Sodium: 139 mmol/L (ref 134–144)
eGFR: 40 mL/min/{1.73_m2} — ABNORMAL LOW (ref >59)

## 2021-09-07 ENCOUNTER — Ambulatory Visit: Payer: 59 | Admitting: Adult Health

## 2021-10-03 NOTE — Progress Notes (Signed)
Cardiology Office Note   Date:  10/12/2021   ID:  Derek Duarte, DOB 1960/12/23, MRN [id number]  PCP:  Lawerance Cruel, MD  Cardiologist:   Jorge Amparo Martinique, MD   Chief Complaint  Patient presents with   Hypertension       History of Present Illness: Derek Duarte is a 61 y.o. male who is seen for follow up HTN and primary hyperaldosteronism. He has a hx of resistant hypertension, hypokalemia, hyperlipidemia, DM 2, TIA, OSA on CPAP and atypical chest pain.  Last echocardiogram obtained on 09/07/2018 showed EF 55%, severe LVH, grade 1 DD, mild LAE, otherwise no significant valve issue.  Patient was admitted in January 2020 for chest pain and abnormal EKG.  He was ruled out by troponin and EKG.  Lab work was concerning for severe hypokalemia at 2.7.  He had a Zio monitor at [facility] that showed rare PVC and bigeminy, otherwise no significant arrhythmia.  Myoview study in February 2020 showed no ischemia or infarction.  He has difficult to control blood pressure,    He has been followed by our Hypertensive clinic. With  persistent hypokalemia he had  Laboratory evaluation c/w primary hyperaldosteronism. Started on aldactone and potassium repleted. CT of adrenals showed a 2.5 cm left adrenal nodule felt to be a benign adenoma. Yearly follow up recommended.   He was last seen in the hypertension clinic in November and spironolactone was increased to 75 mg daily.  On follow up today he reports BP is typically 137-145/80. He did have Covid 19 around [place]. Took Paxlovid. Has recovered well but has noted persistent fatigue. Has gained weight. Planning to go on a 6 months sailing trip from [place] to [place] starting in February. Plans to lose weight then.     Past Medical History:  Diagnosis Date   BPH (benign prostatic hyperplasia)    Depression    Diabetes mellitus without complication ([place])    Gout    Hyperlipidemia    Hypertension    Insomnia    Low  testosterone     Past Surgical History:  Procedure Laterality Date   HERNIA REPAIR       Current Outpatient Medications  Medication Sig Dispense Refill   allopurinol (ZYLOPRIM) 300 MG tablet Take 300 mg by mouth daily.     amLODipine (NORVASC) 5 MG tablet Take 5 mg by mouth daily.     aspirin EC 81 MG tablet Take 1 tablet (81 mg total) by mouth daily. Swallow whole. 90 tablet 3   buPROPion (WELLBUTRIN XL) 150 MG 24 hr tablet Take 450 mg by mouth daily. Patient takes 3 tablets daily  2   carvedilol (COREG) 25 MG tablet Take 25 mg by mouth 2 (two) times daily.     cholecalciferol (VITAMIN D3) 25 MCG (1000 UNIT) tablet Take by mouth daily. 2, 000 units daily     co-enzyme Q-10 30 MG capsule Take 30 mg by mouth daily.     dapagliflozin propanediol (FARXIGA) 10 MG TABS tablet Take 10 mg by mouth daily.     doxazosin (CARDURA) 4 MG tablet Take 1 tablet (4 mg total) by mouth daily. 90 tablet 3   escitalopram (LEXAPRO) 10 MG tablet Take 10 mg by mouth daily.     metFORMIN (GLUCOPHAGE) 500 MG tablet Take 500 mg by mouth 2 (two) times daily with a meal. 1 tab 2x a day.     pravastatin (PRAVACHOL) 20 MG tablet Take by mouth  once a week.     spironolactone (ALDACTONE) 50 MG tablet Take 1 tablet (50 mg total) by mouth daily. 135 tablet 1   tamsulosin (FLOMAX) 0.4 MG CAPS capsule Take 0.4 mg by mouth daily.  4   telmisartan-hydrochlorothiazide (MICARDIS HCT) 40-12.5 MG tablet TAKE 1 TABLET BY MOUTH EVERY DAY 90 tablet 1   traZODone (DESYREL) 100 MG tablet Take 100 mg by mouth at bedtime.      vitamin B-12 (CYANOCOBALAMIN) 1000 MCG tablet Take 1,000 mcg by mouth daily.     No current facility-administered medications for this visit.    Allergies:   Patient has no known allergies.    Social History:  The patient  reports that he has never smoked. He has never used smokeless tobacco. He reports current alcohol use of about 2.0 standard drinks per week. He reports that he does not currently use  drugs.   Family History:  The patient's family history includes Atrial fibrillation in his mother; Hypertension in an other family member.    ROS:  Please see the history of present illness.   Otherwise, review of systems are positive for none.   All other systems are reviewed and negative.    PHYSICAL EXAM: VS:  BP 140/80    Pulse 68    Ht 6' (1.829 m)    Wt (!) 309 lb 9.6 oz (140.4 kg)    SpO2 95%    BMI 41.99 kg/m  , BMI Body mass index is 41.99 kg/m. GEN: Well nourished, obese, in no acute distress  HEENT: normal  Neck: no JVD, carotid bruits, or masses Cardiac: RRR; no murmurs, rubs, or gallops,no edema  Respiratory:  clear to auscultation bilaterally, normal work of breathing GI: soft, nontender, nondistended, + BS MS: no deformity or atrophy  Skin: warm and dry, no rash Neuro:  Strength and sensation are intact Psych: euthymic mood, full affect   EKG:  EKG is not ordered today.    Recent Labs: 09/02/2021: BUN 32; Creatinine, Ser 1.89; Potassium 4.7; Sodium 139    Lipid Panel    Component Value Date/Time   CHOL 182 03/14/2019 0916   TRIG 104 03/14/2019 0916   HDL 49 03/14/2019 0916   CHOLHDL 3.7 03/14/2019 0916   LDLCALC 112 (H) 03/14/2019 0916    Dated 06/19/20: A1c 7.3%. cholesterolemia 203, triglycerides 141, HDL 42, LDL 157. Creatinine 1.65. BUN 33. Glucose 192. Potassium 4.1. Otherwise CMET and TSH normal. Dated 12/22/20: A1c 7.4%. creatinine 1.77. potassium 4.4.  Dated 06/16/21: cholesterol 185, triglycerides 281. HDL 37, LDL 101. A1c 6.7%. LFTs normal.   Wt Readings from Last 3 Encounters:  10/12/21 (!) 309 lb 9.6 oz (140.4 kg)  07/29/21 (!) 303 lb (137.4 kg)  04/02/21 (!) 304 lb 6.4 oz (138.1 kg)      Other studies Reviewed: Additional studies/ records that were reviewed today include:   CMyoview 11/15/18: Study Highlights     Blood pressure demonstrated a hypertensive response to exercise. Nuclear stress EF: 44%. There was no ST segment deviation  noted during stress. The left ventricular ejection fraction is moderately decreased (30-44%). This is an intermediate risk study.   Normal resting and stress perfusion. No ischemia or infarction EF EF estimated at 44% with LVE and diffuse hypokinesis suggest TTE/MRI correlation     Echo 09/07/18: Study Conclusions   - Left ventricle: The cavity size was normal. Wall thickness was    increased in a pattern of severe LVH. Systolic function was  normal. The estimated ejection fraction was in the range of 55%    to 60%. Wall motion was normal; there were no regional wall    motion abnormalities. Doppler parameters are consistent with    abnormal left ventricular relaxation (grade 1 diastolic    dysfunction).  - Aortic valve: Trileaflet; mildly thickened, mildly calcified    leaflets.  - Left atrium: The atrium was mildly dilated.  - Right ventricle: The cavity size was mildly dilated. Wall    thickness was normal.   Impressions:   - No cardiac source of emboli was indentified.  CLINICAL DATA:  Hyperaldosteronism.  Adrenal mass.   EXAM: CT ABDOMEN WITHOUT CONTRAST   TECHNIQUE: Multidetector CT imaging of the abdomen was performed following the standard protocol without IV contrast.   COMPARISON:  12/13/2019   FINDINGS: Lower chest: Unremarkable   Hepatobiliary: Unremarkable   Pancreas: Unremarkable   Spleen: Unremarkable   Adrenals/Urinary Tract: Faint nodularity in the left adrenal gland measuring about 2.3 by 1.3 cm on image 96 series 5, density below 10 Hounsfield units suggesting adenoma. There is also a 0.6 cm fat density in the left adrenal gland on image 100 series 5 compatible with myelolipoma. Minimal nodularity in the lateral limb right adrenal gland at 0.7 by 1.1 cm on image 116 series 5.   There are 2 small right kidney lower pole nonobstructive renal calculi, the larger measuring 0.2 cm in long axis. No hydronephrosis or hydroureter.   Stomach/Bowel:  Unremarkable   Vascular/Lymphatic: Aortoiliac atherosclerotic vascular disease.   Other: No supplemental non-categorized findings.   Musculoskeletal: Lumbar spondylosis and degenerative disc disease resulting in multilevel impingement.   IMPRESSION: 1. Stable mild nodularity in both adrenal glands, left greater than right. The small left adrenal nodule has a density favoring adenoma, and there is also a small left myelolipoma. 2. Nonobstructive right nephrolithiasis. 3. Lumbar spondylosis and degenerative disc disease causing multilevel impingement. 4.  Aortic Atherosclerosis (ICD10-I70.0).     Electronically Signed   By: Van Clines M.D.   On: 01/04/2021 16:35    ASSESSMENT AND PLAN:  Hypertension: secondary to hyperaldosteronism. Chronic hypokalemia. BP is fairly well controlled on multiple meds. Stressed importance of lifestyle modification with weight loss and increase aerobic activity.    Hyperlipidemia: Cholesterol is uncontrolled. intolerant of higher dose statin. Now on pravastatin one day a week. Refocus on lifestyle modification and weight loss. Planning repeat lab work after his trip. May need to consider additional therapy if LDL still not at goal < 70.    DM2: Managed by primary care provider. Has neuropathy.    Obstructive sleep apnea: Continue CPAP therapy.   5. Obesity  6.  Left adrenal adenoma. Stable by CT in April. No further follow up needed.  7.  CKD stage 3. Stable.   8. Aortic atherosclerosis. Risk factor modification as above.    Current medicines are reviewed at length with the patient today.  The patient does not have concerns regarding medicines.  The following changes have been made:  no change  Labs/ tests ordered today include:  No orders of the defined types were placed in this encounter.     Disposition:   FU with me in 6 months  Signed, Siboney Requejo Martinique, MD  10/12/2021 8:31 AM    [facility] [address] [place], [place], [address] Phone [phone], Fax [phone]

## 2021-10-12 ENCOUNTER — Other Ambulatory Visit: Payer: Self-pay

## 2021-10-12 ENCOUNTER — Encounter: Payer: Self-pay | Admitting: Cardiology

## 2021-10-12 ENCOUNTER — Ambulatory Visit (INDEPENDENT_AMBULATORY_CARE_PROVIDER_SITE_OTHER): Payer: 59 | Admitting: Cardiology

## 2021-10-12 VITALS — BP 140/80 | HR 68 | Ht 72.0 in | Wt 309.6 lb

## 2021-10-12 DIAGNOSIS — E269 Hyperaldosteronism, unspecified: Secondary | ICD-10-CM

## 2021-10-12 DIAGNOSIS — G4733 Obstructive sleep apnea (adult) (pediatric): Secondary | ICD-10-CM

## 2021-10-12 DIAGNOSIS — I10 Essential (primary) hypertension: Secondary | ICD-10-CM | POA: Diagnosis not present

## 2021-10-12 DIAGNOSIS — E785 Hyperlipidemia, unspecified: Secondary | ICD-10-CM | POA: Diagnosis not present

## 2021-10-12 DIAGNOSIS — I700 Atherosclerosis of aorta: Secondary | ICD-10-CM

## 2022-05-19 DIAGNOSIS — H269 Unspecified cataract: Secondary | ICD-10-CM | POA: Diagnosis not present

## 2022-05-19 DIAGNOSIS — H25812 Combined forms of age-related cataract, left eye: Secondary | ICD-10-CM | POA: Diagnosis not present

## 2022-05-19 DIAGNOSIS — H25012 Cortical age-related cataract, left eye: Secondary | ICD-10-CM | POA: Diagnosis not present

## 2022-05-19 DIAGNOSIS — G4733 Obstructive sleep apnea (adult) (pediatric): Secondary | ICD-10-CM | POA: Diagnosis not present

## 2022-05-19 DIAGNOSIS — H25042 Posterior subcapsular polar age-related cataract, left eye: Secondary | ICD-10-CM | POA: Diagnosis not present

## 2022-05-19 DIAGNOSIS — H2512 Age-related nuclear cataract, left eye: Secondary | ICD-10-CM | POA: Diagnosis not present

## 2022-06-30 DIAGNOSIS — E669 Obesity, unspecified: Secondary | ICD-10-CM | POA: Diagnosis not present

## 2022-06-30 DIAGNOSIS — I10 Essential (primary) hypertension: Secondary | ICD-10-CM | POA: Diagnosis not present

## 2022-06-30 DIAGNOSIS — N400 Enlarged prostate without lower urinary tract symptoms: Secondary | ICD-10-CM | POA: Diagnosis not present

## 2022-06-30 DIAGNOSIS — Z0000 Encounter for general adult medical examination without abnormal findings: Secondary | ICD-10-CM | POA: Diagnosis not present

## 2022-06-30 DIAGNOSIS — Z23 Encounter for immunization: Secondary | ICD-10-CM | POA: Diagnosis not present

## 2022-06-30 DIAGNOSIS — M109 Gout, unspecified: Secondary | ICD-10-CM | POA: Diagnosis not present

## 2022-06-30 DIAGNOSIS — R69 Illness, unspecified: Secondary | ICD-10-CM | POA: Diagnosis not present

## 2022-06-30 DIAGNOSIS — G4700 Insomnia, unspecified: Secondary | ICD-10-CM | POA: Diagnosis not present

## 2022-06-30 DIAGNOSIS — E291 Testicular hypofunction: Secondary | ICD-10-CM | POA: Diagnosis not present

## 2022-06-30 DIAGNOSIS — E1140 Type 2 diabetes mellitus with diabetic neuropathy, unspecified: Secondary | ICD-10-CM | POA: Diagnosis not present

## 2022-06-30 DIAGNOSIS — E785 Hyperlipidemia, unspecified: Secondary | ICD-10-CM | POA: Diagnosis not present

## 2022-07-14 DIAGNOSIS — H2511 Age-related nuclear cataract, right eye: Secondary | ICD-10-CM | POA: Diagnosis not present

## 2022-07-14 DIAGNOSIS — H269 Unspecified cataract: Secondary | ICD-10-CM | POA: Diagnosis not present

## 2022-07-14 DIAGNOSIS — H25011 Cortical age-related cataract, right eye: Secondary | ICD-10-CM | POA: Diagnosis not present

## 2022-07-14 DIAGNOSIS — H25811 Combined forms of age-related cataract, right eye: Secondary | ICD-10-CM | POA: Diagnosis not present

## 2022-07-27 DIAGNOSIS — R69 Illness, unspecified: Secondary | ICD-10-CM | POA: Diagnosis not present

## 2022-09-07 DIAGNOSIS — F329 Major depressive disorder, single episode, unspecified: Secondary | ICD-10-CM | POA: Diagnosis not present

## 2022-09-13 DIAGNOSIS — F900 Attention-deficit hyperactivity disorder, predominantly inattentive type: Secondary | ICD-10-CM | POA: Diagnosis not present

## 2022-09-13 DIAGNOSIS — Z79899 Other long term (current) drug therapy: Secondary | ICD-10-CM | POA: Diagnosis not present

## 2022-09-13 DIAGNOSIS — R69 Illness, unspecified: Secondary | ICD-10-CM | POA: Diagnosis not present

## 2022-09-28 DIAGNOSIS — R69 Illness, unspecified: Secondary | ICD-10-CM | POA: Diagnosis not present

## 2022-10-11 DIAGNOSIS — F900 Attention-deficit hyperactivity disorder, predominantly inattentive type: Secondary | ICD-10-CM | POA: Diagnosis not present

## 2022-10-11 DIAGNOSIS — R69 Illness, unspecified: Secondary | ICD-10-CM | POA: Diagnosis not present

## 2022-10-12 DIAGNOSIS — R69 Illness, unspecified: Secondary | ICD-10-CM | POA: Diagnosis not present

## 2022-10-27 DIAGNOSIS — R69 Illness, unspecified: Secondary | ICD-10-CM | POA: Diagnosis not present

## 2022-11-09 DIAGNOSIS — F329 Major depressive disorder, single episode, unspecified: Secondary | ICD-10-CM | POA: Diagnosis not present

## 2022-12-28 DIAGNOSIS — F329 Major depressive disorder, single episode, unspecified: Secondary | ICD-10-CM | POA: Diagnosis not present

## 2023-01-04 DIAGNOSIS — F329 Major depressive disorder, single episode, unspecified: Secondary | ICD-10-CM | POA: Diagnosis not present

## 2023-01-19 DIAGNOSIS — F329 Major depressive disorder, single episode, unspecified: Secondary | ICD-10-CM | POA: Diagnosis not present

## 2023-01-24 DIAGNOSIS — Z1331 Encounter for screening for depression: Secondary | ICD-10-CM | POA: Diagnosis not present

## 2023-01-24 DIAGNOSIS — F900 Attention-deficit hyperactivity disorder, predominantly inattentive type: Secondary | ICD-10-CM | POA: Diagnosis not present

## 2023-02-01 DIAGNOSIS — F329 Major depressive disorder, single episode, unspecified: Secondary | ICD-10-CM | POA: Diagnosis not present

## 2023-04-02 DIAGNOSIS — T24231A Burn of second degree of right lower leg, initial encounter: Secondary | ICD-10-CM | POA: Diagnosis not present

## 2023-04-02 DIAGNOSIS — L03115 Cellulitis of right lower limb: Secondary | ICD-10-CM | POA: Diagnosis not present

## 2023-04-06 ENCOUNTER — Telehealth: Payer: Self-pay | Admitting: Cardiology

## 2023-04-06 DIAGNOSIS — F900 Attention-deficit hyperactivity disorder, predominantly inattentive type: Secondary | ICD-10-CM | POA: Diagnosis not present

## 2023-04-06 MED ORDER — AMLODIPINE BESYLATE 10 MG PO TABS: 10.0000 mg | ORAL_TABLET | Freq: Every day | ORAL | 1 refills | Status: DC

## 2023-04-06 NOTE — Telephone Encounter (Signed)
Pt reports that He does not usually monitor his BP He went to a physician today and his BP was 189/102 He reports that he has had a headache for the past 3 days.  Dull headache in the back of his head. No other issues. No dizziness, blurred vision, does not feel faint.   Reports that he has a bad burn (second degree) on the back of his leg that is painful- pain may be increasing BP, but this is higher than expected. The provider wanted to send him to the ER, but pt felt fine, so came home and called [place] directly.   Asked pt to check his BP while on the phone:  (it takes 3 readings)  186/102 hr 65    Informed the patient that I would send this information to his provider, and we will call him back with recommendations.

## 2023-04-06 NOTE — Telephone Encounter (Signed)
Pt c/o BP issue: STAT if pt c/o blurred vision, one-sided weakness or slurred speech  1. What are your last 5 BP readings? Today 189/102  2. Are you having any other symptoms (ex. Dizziness, headache, blurred vision, passed out)? Headache   3. What is your BP issue? Patient is concerned because their BP is high. Please advise.

## 2023-04-06 NOTE — Telephone Encounter (Signed)
Dr Wyline Mood- DOD recommends adding Amlodipine 10 mg daily and for the patient to keep a BP log and follow up with an APP in one week for BP management.   Gave the pt the information above. Prescription sent to pt's pharmacy. Appt made with APP for 04/13/23. Given ER precautions. Pt verbalized understanding of all instructions.

## 2023-04-10 NOTE — Progress Notes (Signed)
Cardiology Clinic Note   Patient Name: Derek Duarte Date of Encounter: 04/13/2023  Primary Care Provider:  Daisy Floro, MD Primary Cardiologist:  Derek Swaziland, MD  Patient Profile    62 year old with hx of HTN and primary hyperaldosteronism, HL intolerant to higher doses of statin therapy, Type II diabetes, TIA OSA on CPAP and atypical chest pain. He is followed by hypertension clinic. Started on aldactone and potassium repleted. CT of adrenals showed a 2.5 cm left adrenal nodule felt to be a benign adenoma Last seen by Dr. Swaziland on 10/12/2021.    Past Medical History    Past Medical History:  Diagnosis Date   BPH (benign prostatic hyperplasia)    Depression    Diabetes mellitus without complication (HCC)    Gout    Hyperlipidemia    Hypertension    Insomnia    Low testosterone    Past Surgical History:  Procedure Laterality Date   HERNIA REPAIR      Allergies  No Known Allergies  History of Present Illness    [name] returns today for ongoing assessment and management of hypertension.  He is also followed by the hypertensive clinic.  He was recently taken off of his ADD medications due to hypertension with a blood pressure of 188/102.  He started back on amlodipine at a lower dose at 10 mg daily.  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  Blood pressures been better controlled off of ADD meds.  He denies any chest pain, palpations, dizziness or fatigue.  Home Medications    Current Outpatient Medications  Medication Sig Dispense Refill   allopurinol (ZYLOPRIM) 300 MG tablet Take 300 mg by mouth daily.     aspirin EC 81 MG tablet Take 1 tablet (81 mg total) by mouth daily. Swallow whole. 90 tablet 3   buPROPion (WELLBUTRIN XL) 150 MG 24 hr tablet Take 450 mg by mouth daily. Patient takes 3 tablets daily  2   carvedilol (COREG) 25 MG tablet Take 25 mg by mouth 2 (two) times daily.     cholecalciferol  (VITAMIN D3) 25 MCG (1000 UNIT) tablet Take by mouth daily. 2, 000 units daily     co-enzyme Q-10 30 MG capsule Take 30 mg by mouth daily.     doxazosin (CARDURA) 4 MG tablet Take 1 tablet (4 mg total) by mouth daily. 90 tablet 3   empagliflozin (JARDIANCE) 25 MG TABS tablet Take 25 mg by mouth daily.     escitalopram (LEXAPRO) 10 MG tablet Take 10 mg by mouth daily.     metFORMIN (GLUCOPHAGE) 500 MG tablet Take 500 mg by mouth 2 (two) times daily with a meal. 1 tab 2x a day.     pravastatin (PRAVACHOL) 20 MG tablet Take by mouth once a week.     spironolactone (ALDACTONE) 50 MG tablet Take 1 tablet (50 mg total) by mouth daily. 135 tablet 1   tamsulosin (FLOMAX) 0.4 MG CAPS capsule Take 0.4 mg by mouth daily.  4   telmisartan-hydrochlorothiazide (MICARDIS HCT) 40-12.5 MG tablet TAKE 1 TABLET BY MOUTH EVERY DAY 90 tablet 1   traZODone (DESYREL) 100 MG tablet Take 100 mg by mouth at bedtime.      vitamin B-12 (CYANOCOBALAMIN) 1000 MCG tablet Take 1,000 mcg by mouth daily.     amLODipine (NORVASC) 10 MG tablet Take 1 tablet (10 mg total) by mouth daily. 90 tablet 3  amphetamine-dextroamphetamine (ADDERALL XR) 20 MG 24 hr capsule Take 20 mg by mouth every morning. (Patient not taking: Reported on 04/13/2023)     dapagliflozin propanediol (FARXIGA) 10 MG TABS tablet Take 10 mg by mouth daily. (Patient not taking: Reported on 04/13/2023)     No current facility-administered medications for this visit.     Family History    Family History  Problem Relation Age of Onset   Hypertension Other    Atrial fibrillation Mother        had ablation   He indicated that his mother is alive. He indicated that his father is alive. He indicated that the status of his other is unknown.  Social History    Social History   Socioeconomic History   Marital status: Married    Spouse name: Not on file   Number of children: Not on file   Years of education: Not on file   Highest education level: Not on file   Occupational History   Not on file  Tobacco Use   Smoking status: Never   Smokeless tobacco: Never  Substance and Sexual Activity   Alcohol use: Yes    Alcohol/week: 2.0 standard drinks of alcohol    Types: 2 Cans of beer per week   Drug use: Not Currently   Sexual activity: Not on file  Other Topics Concern   Not on file  Social History Narrative   Not on file   Social Determinants of Health   Financial Resource Strain: Not on file  Food Insecurity: Not on file  Transportation Needs: Not on file  Physical Activity: Not on file  Stress: Not on file  Social Connections: Not on file  Intimate Partner Violence: Not on file     Review of Systems    General:  No chills, fever, night sweats or weight changes.  Cardiovascular:  No chest pain, dyspnea on exertion, edema, orthopnea, palpitations, paroxysmal nocturnal dyspnea. Dermatological: No rash, lesions/masses Respiratory: No cough, dyspnea Urologic: No hematuria, dysuria Abdominal:   No nausea, vomiting, diarrhea, bright red blood per rectum, melena, or hematemesis Neurologic:  No visual changes, wkns, changes in mental status. All other systems reviewed and are otherwise negative except as noted above.  EKG Interpretation Date/Time:  Thursday April 13 2023 09:01:08 EDT Ventricular Rate:  65 PR Interval:  188 QRS Duration:  116 QT Interval:  428 QTC Calculation: 445 R Axis:   41  Text Interpretation: Normal sinus rhythm Normal ECG When compared with ECG of 22-Nov-2019 08:53, PREVIOUS ECG IS PRESENT Confirmed by Joni Reining [phone]) on 04/13/2023 9:11:09 AM    Physical Exam    VS:  BP 138/78 (BP Location: Right Arm, Patient Position: Sitting, Cuff Size: Large)   Pulse 65   Ht 6' (1.829 m)   Wt 278 lb 9.6 oz (126.4 kg)   SpO2 94%   BMI 37.78 kg/m  , BMI Body mass index is 37.78 kg/m.     GEN: Well nourished, well developed, in no acute distress. HEENT: normal. Neck: Supple, no JVD, carotid bruits, or  masses. Cardiac: RRR, no murmurs, rubs, or gallops. No clubbing, cyanosis, edema.  Radials/DP/PT 2+ and equal bilaterally.  Respiratory:  Respirations regular and unlabored, clear to auscultation bilaterally. GI: Soft, nontender, nondistended, BS + x 4. MS: no deformity or atrophy. Skin: warm and dry, no rash. Neuro:  Strength and sensation are intact. Psych: Normal affect.  EKG Interpretation Date/Time:  Thursday April 13 2023 09:01:08 EDT Ventricular Rate:  47  PR Interval:  188 QRS Duration:  116 QT Interval:  428 QTC Calculation: 445 R Axis:   41  Text Interpretation: Normal sinus rhythm Normal ECG When compared with ECG of 22-Nov-2019 08:53, PREVIOUS ECG IS PRESENT Confirmed by Joni Reining [phone]) on 04/13/2023 9:11:09 AM   Lab Results  Component Value Date   WBC 7.2 11/22/2019   HGB 16.3 11/22/2019   HCT 48.0 11/22/2019   MCV 82.3 11/22/2019   PLT 190 11/22/2019   Lab Results  Component Value Date   CREATININE 1.89 (H) 09/02/2021   BUN 32 (H) 09/02/2021   NA 139 09/02/2021   K 4.7 09/02/2021   CL 102 09/02/2021   CO2 21 09/02/2021   Lab Results  Component Value Date   ALT 24 11/22/2019   AST 19 11/22/2019   ALKPHOS 111 11/22/2019   BILITOT 0.9 11/22/2019   Lab Results  Component Value Date   CHOL 182 03/14/2019   HDL 49 03/14/2019   LDLCALC 112 (H) 03/14/2019   TRIG 104 03/14/2019   CHOLHDL 3.7 03/14/2019    No results found for: "HGBA1C"   Review of Prior Studies CMyoview 11/15/18: Study Highlights     Blood pressure demonstrated a hypertensive response to exercise. Nuclear stress EF: 44%. There was no ST segment deviation noted during stress. The left ventricular ejection fraction is moderately decreased (30-44%). This is an intermediate risk study.   Normal resting and stress perfusion. No ischemia or infarction EF EF estimated at 44% with LVE and diffuse hypokinesis suggest TTE/MRI correlation     Echo 09/07/18: Study Conclusions   -  Left ventricle: The cavity size was normal. Wall thickness was    increased in a pattern of severe LVH. Systolic function was    normal. The estimated ejection fraction was in the range of 55%    to 60%. Wall motion was normal; there were no regional wall    motion abnormalities. Doppler parameters are consistent with    abnormal left ventricular relaxation (grade 1 diastolic    dysfunction).  - Aortic valve: Trileaflet; mildly thickened, mildly calcified    leaflets.  - Left atrium: The atrium was mildly dilated.  - Right ventricle: The cavity size was mildly dilated. Wall    thickness was normal.   Impressions:   - No cardiac source of emboli was indentifiedEKG Interpretation Date/Time:  Thursday April 13 2023 09:01:08 EDT Ventricular Rate:  65 PR Interval:  188 QRS Duration:  116 QT Interval:  428 QTC Calculation: 445 R Axis:   41  Text Interpretation: Normal sinus rhythm Normal ECG When compared with ECG of 22-Nov-2019 08:53, PREVIOUS ECG IS PRESENT Confirmed by Joni Reining [phone]) on 04/13/2023 9:11:09 AM    Assessment & Plan   1.  Hypertension with history of hyperaldosteronism.  Blood pressure is controlled today on amlodipine 10 mg daily and spironolactone.  He was taking off of his Adderall due to hypertension.  He is requesting whether or not he should restart this.  He states he does much better on it.  If he does restart it he will need to monitor his blood pressure as we will need to confirm with increased heart rate associated with ADD meds along with hypertension.  Will follow-up with PCP concerning his ADD medications.  He is to keep up with his blood pressure at home if he restarts the Adderall, and if it remains elevated he may have to use an alternative medication.  2.  Hypercholesterolemia: The patient  remains on pravastatin 20 mg daily.  Labs are completed by his primary care provider in September around his birthday annually.  Goal of LDL less than 100.  3.   Non-insulin-dependent diabetes: Followed by PCP.  Weight loss, purposeful exercise, low-carb diet is recommended with ongoing medication management as directed by primary care.       Signed, Bettey Mare. Liborio Nixon, ANP, AACC   04/13/2023 10:30 AM      Office [phone] Fax [phone]  Notice: This dictation was prepared with Dragon dictation along with smaller phrase technology. Any transcriptional errors that result from this process are unintentional and may not be corrected upon review.

## 2023-04-13 ENCOUNTER — Ambulatory Visit: Payer: 59 | Attending: Adult Health | Admitting: Adult Health

## 2023-04-13 ENCOUNTER — Encounter: Payer: Self-pay | Admitting: Adult Health

## 2023-04-13 VITALS — BP 138/78 | HR 65 | Ht 72.0 in | Wt 278.6 lb

## 2023-04-13 DIAGNOSIS — I10 Essential (primary) hypertension: Secondary | ICD-10-CM

## 2023-04-13 DIAGNOSIS — E269 Hyperaldosteronism, unspecified: Secondary | ICD-10-CM | POA: Diagnosis not present

## 2023-04-13 DIAGNOSIS — E7800 Pure hypercholesterolemia, unspecified: Secondary | ICD-10-CM

## 2023-04-13 MED ORDER — AMLODIPINE BESYLATE 10 MG PO TABS: 10.0000 mg | ORAL_TABLET | Freq: Every day | ORAL | 3 refills | Status: AC

## 2023-04-13 MED ORDER — AMLODIPINE BESYLATE 10 MG PO TABS: 10.0000 mg | ORAL_TABLET | Freq: Every day | ORAL | 3 refills | Status: DC

## 2023-04-13 NOTE — Patient Instructions (Signed)
Medication Instructions:  No Changes *If you need a refill on your cardiac medications before your next appointment, please call your pharmacy*   Lab Work: No labs If you have labs (blood work) drawn today and your tests are completely normal, you will receive your results only by: MyChart Message (if you have MyChart) OR A paper copy in the mail If you have any lab test that is abnormal or we need to change your treatment, we will call you to review the results.   Testing/Procedures: No Testing   Follow-Up: At [facility], you and your health needs are our priority.  As part of our continuing mission to provide you with exceptional heart care, we have created designated Provider Care Teams.  These Care Teams include your primary Cardiologist (physician) and Advanced Practice Providers (APPs -  Physician Assistants and Nurse Practitioners) who all work together to provide you with the care you need, when you need it.  We recommend signing up for the patient portal called "MyChart".  Sign up information is provided on this After Visit Summary.  MyChart is used to connect with patients for Virtual Visits (Telemedicine).  Patients are able to view lab/test results, encounter notes, upcoming appointments, etc.  Non-urgent messages can be sent to your provider as well.   To learn more about what you can do with MyChart, go to [email or web address].    Your next appointment:   6 month(s)  Provider:   Peter Swaziland, MD

## 2023-05-15 DIAGNOSIS — F900 Attention-deficit hyperactivity disorder, predominantly inattentive type: Secondary | ICD-10-CM | POA: Diagnosis not present

## 2023-05-20 DIAGNOSIS — F413 Other mixed anxiety disorders: Secondary | ICD-10-CM | POA: Diagnosis not present

## 2023-05-25 DIAGNOSIS — Z125 Encounter for screening for malignant neoplasm of prostate: Secondary | ICD-10-CM | POA: Diagnosis not present

## 2023-05-25 DIAGNOSIS — E1140 Type 2 diabetes mellitus with diabetic neuropathy, unspecified: Secondary | ICD-10-CM | POA: Diagnosis not present

## 2023-05-25 DIAGNOSIS — Z13220 Encounter for screening for lipoid disorders: Secondary | ICD-10-CM | POA: Diagnosis not present

## 2023-05-25 DIAGNOSIS — Z0000 Encounter for general adult medical examination without abnormal findings: Secondary | ICD-10-CM | POA: Diagnosis not present

## 2023-05-25 DIAGNOSIS — E291 Testicular hypofunction: Secondary | ICD-10-CM | POA: Diagnosis not present

## 2023-05-25 DIAGNOSIS — M109 Gout, unspecified: Secondary | ICD-10-CM | POA: Diagnosis not present

## 2023-06-02 DIAGNOSIS — E2601 Conn's syndrome: Secondary | ICD-10-CM | POA: Diagnosis not present

## 2023-06-02 DIAGNOSIS — I10 Essential (primary) hypertension: Secondary | ICD-10-CM | POA: Diagnosis not present

## 2023-06-02 DIAGNOSIS — E291 Testicular hypofunction: Secondary | ICD-10-CM | POA: Diagnosis not present

## 2023-06-02 DIAGNOSIS — N400 Enlarged prostate without lower urinary tract symptoms: Secondary | ICD-10-CM | POA: Diagnosis not present

## 2023-06-02 DIAGNOSIS — Z0000 Encounter for general adult medical examination without abnormal findings: Secondary | ICD-10-CM | POA: Diagnosis not present

## 2023-06-02 DIAGNOSIS — E1140 Type 2 diabetes mellitus with diabetic neuropathy, unspecified: Secondary | ICD-10-CM | POA: Diagnosis not present

## 2023-06-02 DIAGNOSIS — F324 Major depressive disorder, single episode, in partial remission: Secondary | ICD-10-CM | POA: Diagnosis not present

## 2023-06-02 DIAGNOSIS — M109 Gout, unspecified: Secondary | ICD-10-CM | POA: Diagnosis not present

## 2023-06-02 DIAGNOSIS — G4700 Insomnia, unspecified: Secondary | ICD-10-CM | POA: Diagnosis not present

## 2023-06-02 DIAGNOSIS — E785 Hyperlipidemia, unspecified: Secondary | ICD-10-CM | POA: Diagnosis not present

## 2023-06-02 DIAGNOSIS — Z6837 Body mass index (BMI) 37.0-37.9, adult: Secondary | ICD-10-CM | POA: Diagnosis not present

## 2023-06-02 DIAGNOSIS — Z23 Encounter for immunization: Secondary | ICD-10-CM | POA: Diagnosis not present

## 2023-06-29 DIAGNOSIS — R944 Abnormal results of kidney function studies: Secondary | ICD-10-CM | POA: Diagnosis not present

## 2023-08-21 DIAGNOSIS — Z961 Presence of intraocular lens: Secondary | ICD-10-CM | POA: Diagnosis not present

## 2023-08-21 DIAGNOSIS — E113293 Type 2 diabetes mellitus with mild nonproliferative diabetic retinopathy without macular edema, bilateral: Secondary | ICD-10-CM | POA: Diagnosis not present

## 2023-08-21 DIAGNOSIS — H43813 Vitreous degeneration, bilateral: Secondary | ICD-10-CM | POA: Diagnosis not present

## 2023-08-28 DIAGNOSIS — F900 Attention-deficit hyperactivity disorder, predominantly inattentive type: Secondary | ICD-10-CM | POA: Diagnosis not present

## 2023-11-02 DIAGNOSIS — Z713 Dietary counseling and surveillance: Secondary | ICD-10-CM | POA: Diagnosis not present

## 2024-02-22 ENCOUNTER — Ambulatory Visit: Admitting: Physician Assistant

## 2024-02-28 ENCOUNTER — Ambulatory Visit: Admitting: Dermatology
